# Patient Record
Sex: Female | Born: 1990 | Race: White | Hispanic: No | Marital: Single | Smoking: Current every day smoker
Health system: Southern US, Community
[De-identification: ages and names within clinical notes are randomized; demographics above are authoritative.]

## PROBLEM LIST (undated history)

## (undated) DIAGNOSIS — K5190 Ulcerative colitis, unspecified, without complications: Secondary | ICD-10-CM

## (undated) DIAGNOSIS — F419 Anxiety disorder, unspecified: Secondary | ICD-10-CM

## (undated) DIAGNOSIS — K529 Noninfective gastroenteritis and colitis, unspecified: Secondary | ICD-10-CM

---

## 2010-02-26 ENCOUNTER — Emergency Department (HOSPITAL_BASED_OUTPATIENT_CLINIC_OR_DEPARTMENT_OTHER)
Admission: EM | Admit: 2010-02-26 | Discharge: 2010-02-26 | Payer: Self-pay | Source: Home / Self Care | Admitting: Emergency Medicine

## 2010-02-26 ENCOUNTER — Telehealth: Payer: Self-pay | Admitting: Internal Medicine

## 2010-04-28 NOTE — Progress Notes (Signed)
Summary: Schedule NP3 , UC per Dr Leone Payor  Phone Note Outgoing Call Call back at [facility] Phone [phone]   Call placed by: Darcey Nora RN, CGRN,  February 26, 2010 5:01 PM Call placed to: Patient Summary of Call: Per Dr Leone Payor patient was seen at [facility] today for UC, recently moved here from [place].  Patient  needs to be seen tomorrow or Monday.  I have left her a message to call me back .  I have scheduled her to see Willette Cluster RNP 02/27/10 1:30.   Initial call taken by: Darcey Nora RN, CGRN,  February 26, 2010 5:04 PM  Follow-up for Phone Call        Left message for patient to call back regarding appointment for today. Selinda Michaels RN  February 27, 2010 9:27 AM  Patients mother called and cancelled the appointment for today with Willette Cluster, RNP, states that she will continue her GI care in [facility]. Selinda Michaels RN  February 27, 2010 9:30 AM Follow-up by: Selinda Michaels RN,  February 27, 2010 9:31 AM  Additional Follow-up for Phone Call Additional follow up Details #1::        ok - I  asked about that and was told she did not have a GI MD in [place] Thanks  Additional Follow-up by: Iva Boop MD, Clementeen Graham,  February 27, 2010 10:21 AM

## 2010-06-09 LAB — DIFFERENTIAL
Basophils Absolute: 0 10*3/uL (ref 0.0–0.1)
Basophils Relative: 0 % (ref 0–1)
Eosinophils Absolute: 0.2 10*3/uL (ref 0.0–0.7)
Monocytes Absolute: 0.5 10*3/uL (ref 0.1–1.0)
Monocytes Relative: 5 % (ref 3–12)
Neutro Abs: 9.3 10*3/uL — ABNORMAL HIGH (ref 1.7–7.7)
Neutrophils Relative %: 84 % — ABNORMAL HIGH (ref 43–77)

## 2010-06-09 LAB — PREGNANCY, URINE: Preg Test, Ur: NEGATIVE

## 2010-06-09 LAB — COMPREHENSIVE METABOLIC PANEL
ALT: 15 U/L (ref 0–35)
Albumin: 4.6 g/dL (ref 3.5–5.2)
Alkaline Phosphatase: 69 U/L (ref 39–117)
BUN: 21 mg/dL (ref 6–23)
Chloride: 109 mEq/L (ref 96–112)
Glucose, Bld: 99 mg/dL (ref 70–99)
Potassium: 3.9 mEq/L (ref 3.5–5.1)
Sodium: 144 mEq/L (ref 135–145)
Total Bilirubin: 0.4 mg/dL (ref 0.3–1.2)

## 2010-06-09 LAB — CBC
HCT: 36.5 % (ref 36.0–46.0)
MCV: 84.2 fL (ref 78.0–100.0)
RBC: 4.34 MIL/uL (ref 3.87–5.11)
WBC: 11 10*3/uL — ABNORMAL HIGH (ref 4.0–10.5)

## 2010-06-09 LAB — URINALYSIS, ROUTINE W REFLEX MICROSCOPIC
Bilirubin Urine: NEGATIVE
Ketones, ur: NEGATIVE mg/dL
Nitrite: NEGATIVE
Protein, ur: NEGATIVE mg/dL
Urobilinogen, UA: 0.2 mg/dL (ref 0.0–1.0)

## 2010-06-09 LAB — URINE MICROSCOPIC-ADD ON

## 2010-06-09 LAB — WET PREP, GENITAL: Trich, Wet Prep: NONE SEEN

## 2010-06-09 LAB — GC/CHLAMYDIA PROBE AMP, GENITAL
Chlamydia, DNA Probe: NEGATIVE
GC Probe Amp, Genital: NEGATIVE

## 2010-07-15 ENCOUNTER — Emergency Department (HOSPITAL_BASED_OUTPATIENT_CLINIC_OR_DEPARTMENT_OTHER)
Admission: EM | Admit: 2010-07-15 | Discharge: 2010-07-15 | Disposition: A | Discharge status: Home / Self Care | Payer: 59 | Attending: Emergency Medicine | Admitting: Emergency Medicine

## 2010-07-15 DIAGNOSIS — R112 Nausea with vomiting, unspecified: Secondary | ICD-10-CM | POA: Insufficient documentation

## 2010-07-15 DIAGNOSIS — F17200 Nicotine dependence, unspecified, uncomplicated: Secondary | ICD-10-CM | POA: Insufficient documentation

## 2010-07-15 DIAGNOSIS — H9209 Otalgia, unspecified ear: Secondary | ICD-10-CM | POA: Insufficient documentation

## 2010-07-15 DIAGNOSIS — H6120 Impacted cerumen, unspecified ear: Secondary | ICD-10-CM | POA: Insufficient documentation

## 2010-07-15 LAB — DIFFERENTIAL
Basophils Absolute: 0 10*3/uL (ref 0.0–0.1)
Basophils Relative: 1 % (ref 0–1)
Eosinophils Absolute: 0.3 10*3/uL (ref 0.0–0.7)
Eosinophils Relative: 4 % (ref 0–5)
Monocytes Absolute: 0.6 10*3/uL (ref 0.1–1.0)
Monocytes Relative: 8 % (ref 3–12)

## 2010-07-15 LAB — URINALYSIS, ROUTINE W REFLEX MICROSCOPIC
Hgb urine dipstick: NEGATIVE
Nitrite: NEGATIVE
Protein, ur: NEGATIVE mg/dL
Specific Gravity, Urine: 1.027 (ref 1.005–1.030)
Urobilinogen, UA: 1 mg/dL (ref 0.0–1.0)

## 2010-07-15 LAB — CBC
Hemoglobin: 11.6 g/dL — ABNORMAL LOW (ref 12.0–15.0)
MCH: 27.8 pg (ref 26.0–34.0)
MCHC: 33.8 g/dL (ref 30.0–36.0)
RDW: 13.9 % (ref 11.5–15.5)

## 2010-07-15 LAB — URINE MICROSCOPIC-ADD ON

## 2011-06-21 ENCOUNTER — Emergency Department (HOSPITAL_BASED_OUTPATIENT_CLINIC_OR_DEPARTMENT_OTHER)
Admission: EM | Admit: 2011-06-21 | Discharge: 2011-06-21 | Disposition: A | Discharge status: Home / Self Care | Payer: 59 | Attending: Emergency Medicine | Admitting: Emergency Medicine

## 2011-06-21 ENCOUNTER — Encounter (HOSPITAL_BASED_OUTPATIENT_CLINIC_OR_DEPARTMENT_OTHER): Payer: Self-pay | Admitting: *Deleted

## 2011-06-21 DIAGNOSIS — N760 Acute vaginitis: Secondary | ICD-10-CM | POA: Insufficient documentation

## 2011-06-21 DIAGNOSIS — L299 Pruritus, unspecified: Secondary | ICD-10-CM | POA: Insufficient documentation

## 2011-06-21 DIAGNOSIS — Z79899 Other long term (current) drug therapy: Secondary | ICD-10-CM | POA: Insufficient documentation

## 2011-06-21 DIAGNOSIS — B9689 Other specified bacterial agents as the cause of diseases classified elsewhere: Secondary | ICD-10-CM | POA: Insufficient documentation

## 2011-06-21 DIAGNOSIS — R109 Unspecified abdominal pain: Secondary | ICD-10-CM | POA: Insufficient documentation

## 2011-06-21 DIAGNOSIS — R21 Rash and other nonspecific skin eruption: Secondary | ICD-10-CM | POA: Insufficient documentation

## 2011-06-21 DIAGNOSIS — A499 Bacterial infection, unspecified: Secondary | ICD-10-CM | POA: Insufficient documentation

## 2011-06-21 HISTORY — DX: Noninfective gastroenteritis and colitis, unspecified: K52.9

## 2011-06-21 LAB — URINALYSIS, ROUTINE W REFLEX MICROSCOPIC
Glucose, UA: NEGATIVE mg/dL
Ketones, ur: 15 mg/dL — AB
Protein, ur: NEGATIVE mg/dL
Urobilinogen, UA: 1 mg/dL (ref 0.0–1.0)

## 2011-06-21 LAB — BASIC METABOLIC PANEL
BUN: 9 mg/dL (ref 6–23)
Chloride: 101 mEq/L (ref 96–112)
Creatinine, Ser: 0.5 mg/dL (ref 0.50–1.10)
Glucose, Bld: 87 mg/dL (ref 70–99)
Potassium: 3.4 mEq/L — ABNORMAL LOW (ref 3.5–5.1)

## 2011-06-21 LAB — WET PREP, GENITAL
Trich, Wet Prep: NONE SEEN
Yeast Wet Prep HPF POC: NONE SEEN

## 2011-06-21 LAB — DIFFERENTIAL
Lymphs Abs: 0.7 10*3/uL (ref 0.7–4.0)
Monocytes Absolute: 0.4 10*3/uL (ref 0.1–1.0)
Monocytes Relative: 5 % (ref 3–12)
Neutro Abs: 6.8 10*3/uL (ref 1.7–7.7)
Neutrophils Relative %: 84 % — ABNORMAL HIGH (ref 43–77)

## 2011-06-21 LAB — CBC
HCT: 38.2 % (ref 36.0–46.0)
Hemoglobin: 12.6 g/dL (ref 12.0–15.0)
MCH: 27.4 pg (ref 26.0–34.0)
RBC: 4.6 MIL/uL (ref 3.87–5.11)

## 2011-06-21 LAB — URINE MICROSCOPIC-ADD ON

## 2011-06-21 MED ORDER — PERMETHRIN 5 % EX CREA: TOPICAL_CREAM | Freq: Once | CUTANEOUS | Status: AC

## 2011-06-21 MED ORDER — METRONIDAZOLE 500 MG PO TABS: 500.0000 mg | ORAL_TABLET | Freq: Two times a day (BID) | ORAL | Status: AC

## 2011-06-21 NOTE — ED Provider Notes (Signed)
History     CSN: [id number]  Arrival date & time 06/21/11  1725   First MD Initiated Contact with Patient 06/21/11 1758      Chief Complaint  Patient presents with  . Abdominal Pain    (Consider location/radiation/quality/duration/timing/severity/associated sxs/prior treatment) HPI The patient is a 21 yo woman, with a 6-year history of ulcerative colitis, presenting with a 47-month history of abd pain and vaginal discharge.  The patient notes a 53-month history of "crampy" intermittent lower abdominal pain, which comes and goes throughout the day in episodes that last seconds to hours.  She notes no aggravating or alleviating factors.  For the last few days the pain has been radiating to her back, and today the patient also noted feeling nauseous and lightheaded, though with no vomiting.  The patient also notes a 23-month history of thick white vaginal discharge, with no vaginal pain, burning, or odor.  The patient is currently sexually active and does not use contraception, and has been sexually active with 2 partners in the last year.  She has no history of STD's.  The patient also notes a 1-2 week history of an itchy rash on the back of her hands and on her stomach, but is unsure of the etiology.  No diarrhea, constipation, BRBPR or melena, and she states that her symptoms are not characteristic of her typical UC symptoms.  Past Medical History  Diagnosis Date  . Colitis     History reviewed. No pertinent past surgical history.  No family history on file.  History  Substance Use Topics  . Smoking status: Not on file  . Smokeless tobacco: Not on file  . Alcohol Use: No    OB History    Grav Para Term Preterm Abortions TAB SAB Ect Mult Living                  Review of Systems General: no fevers, chills, changes in weight, changes in appetite Skin: see HPI. HEENT: no blurry vision, hearing changes, sore throat Pulm: no dyspnea, coughing, wheezing CV: no chest pain,  palpitations, shortness of breath Abd: see HPI GU: see HPI Ext: no arthralgias, myalgias Neuro: no weakness, numbness, or tingling  Allergies  Review of patient's allergies indicates no known allergies.  Home Medications   Current Outpatient Rx  Name Route Sig Dispense Refill  . IBUPROFEN 200 MG PO TABS Oral Take 200 mg by mouth every 6 (six) hours as needed.    Marland Kitchen MESALAMINE 400 MG PO TBEC Oral Take 400 mg by mouth 3 (three) times daily.    . AZO TABS PO Oral Take by mouth.      BP 125/79  Pulse 113  Temp(Src) 98.8 F (37.1 C) (Oral)  Resp 20  SpO2 100%  LMP 05/24/2011  Physical Exam General: alert, cooperative, and in no apparent distress HEENT: pupils equal round and reactive to light, vision grossly intact, oropharynx clear and non-erythematous  Neck: supple, no lymphadenopathy Lungs: clear to ascultation bilaterally, normal work of respiration, no wheezes, rales, ronchi Heart: regular rate and rhythm, no murmurs, gallops, or rubs Abdomen: soft, non-tender to palpation, non-distended, normal bowel sounds, few scattered punctate erythematous macules noted on abdomen   Pelvic: labia with no external lesions, white discharge noted in vaginal vault Extremities: bilateral dorsal hands and few scattered punctate erythematous macules, also noted in web spaces between fingers Neurologic: alert & oriented X3, cranial nerves II-XII intact, strength grossly intact, sensation intact to light touch  ED Course  Procedures (including critical care time)  Labs Reviewed  URINALYSIS, ROUTINE W REFLEX MICROSCOPIC - Abnormal; Notable for the following:    APPearance CLOUDY (*)    Ketones, ur 15 (*)    Leukocytes, UA SMALL (*)    All other components within normal limits  URINE MICROSCOPIC-ADD ON - Abnormal; Notable for the following:    Squamous Epithelial / LPF FEW (*)    Bacteria, UA MANY (*)    All other components within normal limits  PREGNANCY, URINE  CBC  DIFFERENTIAL    BASIC METABOLIC PANEL  GC/CHLAMYDIA PROBE AMP, GENITAL  RPR  HIV ANTIBODY (ROUTINE TESTING)  WET PREP, GENITAL   No results found.   No diagnosis found.    MDM  The patient is a 21 yo woman with a history of ulcerative colitis, presenting with crampy abdominal pain and vaginal discharge.  # Abd pain/vaginal discharge - differential includes vaginitis vs chalmydia/gonorrhea vs ectopic pregnancy.  Patient states symptoms are unlike her usual UC flares, and she notes no diarrhea or BRBPR. -wet prep shows BV -urine pregnancy test negative -GC/chlamydia pending  # Rash - on dorsum of hands and stomach, itchy, with some areas in web spaces of fingers noted, spares palms/soles.  Appears consistent with Scabies.  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

## 2011-06-21 NOTE — ED Notes (Signed)
Abdominal cramps on and off for a month. Nausea today.

## 2011-06-21 NOTE — Discharge Instructions (Signed)
Bacterial Vaginosis Bacterial vaginosis (BV) is a vaginal infection where the normal balance of bacteria in the vagina is disrupted. The normal balance is then replaced by an overgrowth of certain bacteria. There are several different kinds of bacteria that can cause BV. BV is the most common vaginal infection in women of childbearing age. CAUSES   The cause of BV is not fully understood. BV develops when there is an increase or imbalance of harmful bacteria.   Some activities or behaviors can upset the normal balance of bacteria in the vagina and put women at increased risk including:   Having a new sex partner or multiple sex partners.   Douching.   Using an intrauterine device (IUD) for contraception.   It is not clear what role sexual activity plays in the development of BV. However, women that have never had sexual intercourse are rarely infected with BV.  Women do not get BV from toilet seats, bedding, swimming pools or from touching objects around them.  SYMPTOMS   Grey vaginal discharge.   A fish-like odor with discharge, especially after sexual intercourse.   Itching or burning of the vagina and vulva.   Burning or pain with urination.   Some women have no signs or symptoms at all.  DIAGNOSIS  Your caregiver must examine the vagina for signs of BV. Your caregiver will perform lab tests and look at the sample of vaginal fluid through a microscope. They will look for bacteria and abnormal cells (clue cells), a pH test higher than 4.5, and a positive amine test all associated with BV.  RISKS AND COMPLICATIONS   Pelvic inflammatory disease (PID).   Infections following gynecology surgery.   Developing HIV.   Developing herpes virus.  TREATMENT  Sometimes BV will clear up without treatment. However, all women with symptoms of BV should be treated to avoid complications, especially if gynecology surgery is planned. Female partners generally do not need to be treated. However,  BV may spread between female sex partners so treatment is helpful in preventing a recurrence of BV.   BV may be treated with antibiotics. The antibiotics come in either pill or vaginal cream forms. Either can be used with nonpregnant or pregnant women, but the recommended dosages differ. These antibiotics are not harmful to the baby.   BV can recur after treatment. If this happens, a second round of antibiotics will often be prescribed.   Treatment is important for pregnant women. If not treated, BV can cause a premature delivery, especially for a pregnant woman who had a premature birth in the past. All pregnant women who have symptoms of BV should be checked and treated.   For chronic reoccurrence of BV, treatment with a type of prescribed gel vaginally twice a week is helpful.  HOME CARE INSTRUCTIONS   Finish all medication as directed by your caregiver.   Do not have sex until treatment is completed.   Tell your sexual partner that you have a vaginal infection. They should see their caregiver and be treated if they have problems, such as a mild rash or itching.   Practice safe sex. Use condoms. Only have 1 sex partner.  PREVENTION  Basic prevention steps can help reduce the risk of upsetting the natural balance of bacteria in the vagina and developing BV:  Do not have sexual intercourse (be abstinent).   Do not douche.   Use all of the medicine prescribed for treatment of BV, even if the signs and symptoms go away.     Tell your sex partner if you have BV. That way, they can be treated, if needed, to prevent reoccurrence.  SEEK MEDICAL CARE IF:   Your symptoms are not improving after 3 days of treatment.   You have increased discharge, pain, or fever.  MAKE SURE YOU:   Understand these instructions.   Will watch your condition.   Will get help right away if you are not doing well or get worse.  FOR MORE INFORMATION  Division of STD Prevention (DSTDP), Centers for Disease  Control and Prevention: SolutionApps.co.za American Social Health Association (ASHA): www.ashastd.org  Document Released: 03/15/2005 Document Revised: 03/04/2011 Document Reviewed: 09/05/2008 [facility] Patient Information 2012 [place], [place].   Scabies Scabies are small bugs (mites) that burrow under the skin and cause red bumps and severe itching. These bugs can only be seen with a microscope. Scabies are highly contagious. They can spread easily from person to person by direct contact. They are also spread through sharing clothing or linens that have the scabies mites living in them. It is not unusual for an entire family to become infected through shared towels, clothing, or bedding.  HOME CARE INSTRUCTIONS   Your caregiver may prescribe a cream or lotion to kill the mites. If this cream is prescribed; massage the cream into the entire area of the body from the neck to the bottom of both feet. Also massage the cream into the scalp and face if your child is less than 14 year old. Avoid the eyes and mouth.   Leave the cream on for 8 to12 hours. Do not wash your hands after application. Your child should bathe or shower after the 8 to 12 hour application period. Sometimes it is helpful to apply the cream to your child at right before bedtime.   One treatment is usually effective and will eliminate approximately 95% of infestations. For severe cases, your caregiver may decide to repeat the treatment in 1 week. Everyone in your household should be treated with one application of the cream.   New rashes or burrows should not appear after successful treatment within 24 to 48 hours; however the itching and rash may last for 2 to 4 weeks after successful treatment. If your symptoms persist longer than this, see your caregiver.   Your caregiver also may prescribe a medication to help with the itching or to help the rash go away more quickly.   Scabies can live on clothing or linens for up to 3 days. Your  entire child's recently used clothing, towels, stuffed toys, and bed linens should be washed in hot water and then dried in a dryer for at least 20 minutes on high heat. Items that cannot be washed should be enclosed in a plastic bag for at least 3 days.   To help relieve itching, bathe your child in a cool bath or apply cool washcloths to the affected areas.   Your child may return to school after treatment with the prescribed cream.  SEEK MEDICAL CARE IF:   The itching persists longer than 4 weeks after treatment.   The rash spreads or becomes infected (the area has red blisters or yellow-tan crust).  Document Released: 03/15/2005 Document Revised: 03/04/2011 Document Reviewed: 07/24/2008 [facility] Patient Information 2012 [place], [place].   RESOURCE GUIDE  Dental Problems  Patients with Medicaid: [facility] [phone] [address]  [address] Phone:  [phone]                                                  Phone:  [phone]  If unable to pay or uninsured, contact:  [facility] or [facility]. to become qualified for the adult dental clinic.  Chronic Pain Problems Contact [facility]  [phone] Patients need to be referred by their primary care doctor.  Insufficient Money for Medicine Contact United Way:  call "211" or [facility] [phone].  No Primary Care Doctor Call [facility]  8023091138 Other agencies that provide inexpensive medical care    [facility]  [phone]    [facility]  [phone]    [facility]  [phone]    [facility]  [phone]    [facility]  [phone]    [facility]  [phone]  Psychological Services [facility]  [phone] [facility]  [phone] [facility]   [phone] (emergency services [phone])  Substance  Abuse Resources [facility] Drug Services  [phone] [facility] [phone] [facility] [place] [phone] Floydene Flock [phone] Residential & Outpatient Substance Abuse Program  [phone]  Abuse/Neglect [facility] Child Abuse Hotline (972)483-2977 [facility] Child Abuse Hotline 567-611-5394 (After Hours)  Emergency Shelter [facility] [phone]  Maternity Homes [facility] [phone] [facility] [phone]  MRSA Hotline #:   608-355-6879    [facility] Resources  [facility] [place]     United [employer]                          [facility] Dept. [address] [place]                       [address]      [address]  Blondell Reveal Phone:  [phone]                                   Phone:  [phone]                 Phone:  [phone]  [facility] Phone:  [phone]  [facility] Child Abuse Hotline 423-064-4825 [phone] (After Hours)

## 2011-06-22 LAB — RPR: RPR Ser Ql: NONREACTIVE

## 2011-06-22 NOTE — ED Provider Notes (Signed)
I saw and evaluated the patient, reviewed the resident's note and I agree with the findings and plan.   .Face to face Exam:  General:  Awake HEENT:  Atraumatic Resp:  Normal effort Abd:  Nondistended Neuro:No focal weakness Lymph: No adenopathy   Nelia Shi, MD 06/22/11 1247

## 2012-08-28 ENCOUNTER — Emergency Department (HOSPITAL_BASED_OUTPATIENT_CLINIC_OR_DEPARTMENT_OTHER)
Admission: EM | Admit: 2012-08-28 | Discharge: 2012-08-28 | Disposition: A | Discharge status: Home / Self Care | Payer: Self-pay | Attending: Emergency Medicine | Admitting: Emergency Medicine

## 2012-08-28 ENCOUNTER — Encounter (HOSPITAL_BASED_OUTPATIENT_CLINIC_OR_DEPARTMENT_OTHER): Payer: Self-pay

## 2012-08-28 DIAGNOSIS — F17200 Nicotine dependence, unspecified, uncomplicated: Secondary | ICD-10-CM | POA: Insufficient documentation

## 2012-08-28 DIAGNOSIS — Z202 Contact with and (suspected) exposure to infections with a predominantly sexual mode of transmission: Secondary | ICD-10-CM | POA: Insufficient documentation

## 2012-08-28 DIAGNOSIS — Z331 Pregnant state, incidental: Secondary | ICD-10-CM | POA: Insufficient documentation

## 2012-08-28 DIAGNOSIS — Z79899 Other long term (current) drug therapy: Secondary | ICD-10-CM | POA: Insufficient documentation

## 2012-08-28 DIAGNOSIS — Z8719 Personal history of other diseases of the digestive system: Secondary | ICD-10-CM | POA: Insufficient documentation

## 2012-08-28 LAB — URINALYSIS, ROUTINE W REFLEX MICROSCOPIC
Glucose, UA: NEGATIVE mg/dL
Ketones, ur: 15 mg/dL — AB
Protein, ur: NEGATIVE mg/dL

## 2012-08-28 LAB — URINE MICROSCOPIC-ADD ON

## 2012-08-28 LAB — WET PREP, GENITAL
Trich, Wet Prep: NONE SEEN
Yeast Wet Prep HPF POC: NONE SEEN

## 2012-08-28 MED ORDER — CEFTRIAXONE SODIUM 250 MG IJ SOLR
250.0000 mg | Freq: Once | INTRAMUSCULAR | Status: AC
  Administered 2012-08-28: 250 mg via INTRAMUSCULAR
  Filled 2012-08-28: qty 250

## 2012-08-28 MED ORDER — AZITHROMYCIN 250 MG PO TABS
1000.0000 mg | ORAL_TABLET | Freq: Once | ORAL | Status: AC
  Administered 2012-08-28: 1000 mg via ORAL
  Filled 2012-08-28: qty 4

## 2012-08-28 NOTE — ED Provider Notes (Signed)
History     CSN: [id number]  Arrival date & time 08/28/12  1324   First MD Initiated Contact with Patient 08/28/12 1335      Chief Complaint  Patient presents with  . Exposure to STD  . Vaginal Discharge    (Consider location/radiation/quality/duration/timing/severity/associated sxs/prior treatment) Patient is a 22 y.o. female presenting with STD exposure and vaginal discharge. The history is provided by the patient. No language interpreter was used.  Exposure to STD This is a new problem. The current episode started in the past 7 days. The problem occurs constantly. The problem has been gradually worsening. Pertinent negatives include no abdominal pain or fever. Nothing aggravates the symptoms. She has tried nothing for the symptoms.  Vaginal Discharge Associated symptoms: no abdominal pain and no fever   Pt reports partner was diagnosed with gonorhea  Past Medical History  Diagnosis Date  . Colitis     History reviewed. No pertinent past surgical history.  No family history on file.  History  Substance Use Topics  . Smoking status: Current Every Day Smoker  . Smokeless tobacco: Not on file  . Alcohol Use: Yes    OB History   Grav Para Term Preterm Abortions TAB SAB Ect Mult Living                  Review of Systems  Constitutional: Negative for fever.  Gastrointestinal: Negative for abdominal pain.  Genitourinary: Positive for vaginal discharge.  All other systems reviewed and are negative.    Allergies  Review of patient's allergies indicates no known allergies.  Home Medications   Current Outpatient Rx  Name  Route  Sig  Dispense  Refill  . ibuprofen (ADVIL,MOTRIN) 200 MG tablet   Oral   Take 200 mg by mouth every 6 (six) hours as needed.         . mesalamine (ASACOL) 400 MG EC tablet   Oral   Take 400 mg by mouth 3 (three) times daily.         . Phenazopyridine HCl (AZO TABS PO)   Oral   Take by mouth.           BP 124/73  Pulse 102   Temp(Src) 99 F (37.2 C)  Resp 16  Ht 5\' 1"  (1.549 m)  Wt 90 lb (40.824 kg)  BMI 17.01 kg/m2  SpO2 100%  LMP 07/11/2012  Physical Exam  Constitutional: She is oriented to person, place, and time. She appears well-developed.  HENT:  Head: Normocephalic.  Right Ear: External ear normal.  Left Ear: External ear normal.  Eyes: Conjunctivae and EOM are normal. Pupils are equal, round, and reactive to light.  Neck: Normal range of motion. Neck supple.  Cardiovascular: Normal rate.   Pulmonary/Chest: Effort normal and breath sounds normal.  Abdominal: Soft.  Genitourinary: Vaginal discharge found.  Musculoskeletal: Normal range of motion.  Neurological: She is alert and oriented to person, place, and time. She has normal reflexes.  Skin: Skin is warm.  Psychiatric: She has a normal mood and affect.    ED Course  Procedures (including critical care time)  Labs Reviewed  URINALYSIS, ROUTINE W REFLEX MICROSCOPIC - Abnormal; Notable for the following:    Ketones, ur 15 (*)    Leukocytes, UA TRACE (*)    All other components within normal limits  PREGNANCY, URINE - Abnormal; Notable for the following:    Preg Test, Ur POSITIVE (*)    All other components within normal limits  URINE MICROSCOPIC-ADD ON - Abnormal; Notable for the following:    Squamous Epithelial / LPF FEW (*)    Bacteria, UA FEW (*)    All other components within normal limits  WET PREP, GENITAL  GC/CHLAMYDIA PROBE AMP   No results found.   1. Exposure to STD   2. Pregnancy as incidental finding       MDM  Rocephin and zithromax        Elson Areas, PA-C 08/28/12 [address] [place], [place] 08/28/12 1424

## 2012-08-28 NOTE — ED Notes (Signed)
Vaginal d/c x 1 week-was advised by sexual partner +gonorrhea

## 2012-08-28 NOTE — ED Provider Notes (Signed)
Medical screening examination/treatment/procedure(s) were performed by non-physician practitioner and as supervising physician I was immediately available for consultation/collaboration.   Dawna Jakes B. Fannye Myer, MD 08/28/12 1451 

## 2012-08-29 LAB — GC/CHLAMYDIA PROBE AMP: CT Probe RNA: NEGATIVE

## 2012-09-07 NOTE — ED Notes (Signed)
Patient called for test results and was informed that test are negative.

## 2012-12-29 ENCOUNTER — Emergency Department (HOSPITAL_BASED_OUTPATIENT_CLINIC_OR_DEPARTMENT_OTHER): Payer: 59

## 2012-12-29 ENCOUNTER — Encounter (HOSPITAL_BASED_OUTPATIENT_CLINIC_OR_DEPARTMENT_OTHER): Payer: Self-pay

## 2012-12-29 ENCOUNTER — Emergency Department (HOSPITAL_BASED_OUTPATIENT_CLINIC_OR_DEPARTMENT_OTHER)
Admission: EM | Admit: 2012-12-29 | Discharge: 2012-12-29 | Disposition: A | Discharge status: Home / Self Care | Payer: Self-pay | Attending: Emergency Medicine | Admitting: Emergency Medicine

## 2012-12-29 DIAGNOSIS — Z3202 Encounter for pregnancy test, result negative: Secondary | ICD-10-CM | POA: Insufficient documentation

## 2012-12-29 DIAGNOSIS — K5190 Ulcerative colitis, unspecified, without complications: Secondary | ICD-10-CM | POA: Insufficient documentation

## 2012-12-29 DIAGNOSIS — Z79899 Other long term (current) drug therapy: Secondary | ICD-10-CM | POA: Insufficient documentation

## 2012-12-29 DIAGNOSIS — F17200 Nicotine dependence, unspecified, uncomplicated: Secondary | ICD-10-CM | POA: Insufficient documentation

## 2012-12-29 LAB — COMPREHENSIVE METABOLIC PANEL
Albumin: 4 g/dL (ref 3.5–5.2)
BUN: 10 mg/dL (ref 6–23)
Chloride: 101 mEq/L (ref 96–112)
Creatinine, Ser: 0.7 mg/dL (ref 0.50–1.10)
GFR calc Af Amer: >90 mL/min (ref >90)
Glucose, Bld: 96 mg/dL (ref 70–99)
Total Bilirubin: 0.2 mg/dL — ABNORMAL LOW (ref 0.3–1.2)

## 2012-12-29 LAB — CBC WITH DIFFERENTIAL/PLATELET
Basophils Relative: 0 % (ref 0–1)
Eosinophils Absolute: 0.3 10*3/uL (ref 0.0–0.7)
HCT: 37.8 % (ref 36.0–46.0)
Hemoglobin: 12.2 g/dL (ref 12.0–15.0)
MCH: 26.4 pg (ref 26.0–34.0)
MCHC: 32.3 g/dL (ref 30.0–36.0)
Monocytes Absolute: 0.7 10*3/uL (ref 0.1–1.0)
Monocytes Relative: 5 % (ref 3–12)

## 2012-12-29 LAB — URINE MICROSCOPIC-ADD ON

## 2012-12-29 LAB — URINALYSIS, ROUTINE W REFLEX MICROSCOPIC
Bilirubin Urine: NEGATIVE
Hgb urine dipstick: NEGATIVE
Specific Gravity, Urine: 1.021 (ref 1.005–1.030)
pH: 7.5 (ref 5.0–8.0)

## 2012-12-29 LAB — LIPASE, BLOOD: Lipase: 22 U/L (ref 11–59)

## 2012-12-29 MED ORDER — PREDNISONE 10 MG PO TABS: 20.0000 mg | ORAL_TABLET | Freq: Every day | ORAL | Status: DC

## 2012-12-29 MED ORDER — IOHEXOL 300 MG/ML  SOLN
50.0000 mL | Freq: Once | INTRAMUSCULAR | Status: AC | PRN
  Administered 2012-12-29: 50 mL via ORAL

## 2012-12-29 MED ORDER — IOHEXOL 300 MG/ML  SOLN
100.0000 mL | Freq: Once | INTRAMUSCULAR | Status: AC | PRN
  Administered 2012-12-29: 100 mL via INTRAVENOUS

## 2012-12-29 MED ORDER — HYDROMORPHONE HCL PF 1 MG/ML IJ SOLN
1.0000 mg | Freq: Once | INTRAMUSCULAR | Status: AC
  Administered 2012-12-29: 1 mg via INTRAVENOUS
  Filled 2012-12-29: qty 1

## 2012-12-29 MED ORDER — PREDNISONE 50 MG PO TABS
60.0000 mg | ORAL_TABLET | Freq: Once | ORAL | Status: AC
  Administered 2012-12-29: 60 mg via ORAL
  Filled 2012-12-29 (×2): qty 1

## 2012-12-29 MED ORDER — OXYCODONE-ACETAMINOPHEN 5-325 MG PO TABS: 2.0000 | ORAL_TABLET | ORAL | Status: DC | PRN

## 2012-12-29 MED ORDER — ONDANSETRON 8 MG PO TBDP: 8.0000 mg | ORAL_TABLET | Freq: Three times a day (TID) | ORAL | Status: DC | PRN

## 2012-12-29 MED ORDER — SODIUM CHLORIDE 0.9 % IV BOLUS (SEPSIS)
1000.0000 mL | Freq: Once | INTRAVENOUS | Status: AC
  Administered 2012-12-29: 1000 mL via INTRAVENOUS

## 2012-12-29 MED ORDER — ONDANSETRON HCL 4 MG/2ML IJ SOLN
4.0000 mg | Freq: Once | INTRAMUSCULAR | Status: AC
  Administered 2012-12-29: 4 mg via INTRAVENOUS
  Filled 2012-12-29: qty 2

## 2012-12-29 NOTE — ED Provider Notes (Signed)
CSN: [id number]     Arrival date & time 12/29/12  1554 History   First MD Initiated Contact with Patient 12/29/12 1642     Chief Complaint  Patient presents with  . Abdominal Pain   (Consider location/radiation/quality/duration/timing/severity/associated sxs/prior Treatment) HPI 22 y.o. Female with history of ulcerative colitis who hasn't been treated for one year who states she has chronic diarrhea and base line diffuse abominal pain with worsened pain and diarrhea today.  No fever or chills.  She is not on any immunosuppressant.  She has regular menses.  She denies uti symptoms.  She has not been seen by a physician since moving to [place] from [place] a year ago and no longer has health insurance.  Past Medical History  Diagnosis Date  . Colitis    History reviewed. No pertinent past surgical history. No family history on file. History  Substance Use Topics  . Smoking status: Current Every Day Smoker  . Smokeless tobacco: Not on file  . Alcohol Use: Yes   OB History   Grav Para Term Preterm Abortions TAB SAB Ect Mult Living                 Review of Systems  All other systems reviewed and are negative.    Allergies  Review of patient's allergies indicates no known allergies.  Home Medications   Current Outpatient Rx  Name  Route  Sig  Dispense  Refill  . ibuprofen (ADVIL,MOTRIN) 200 MG tablet   Oral   Take 200 mg by mouth every 6 (six) hours as needed.         . mesalamine (ASACOL) 400 MG EC tablet   Oral   Take 400 mg by mouth 3 (three) times daily.         . Phenazopyridine HCl (AZO TABS PO)   Oral   Take by mouth.          BP 118/78  Pulse 104  Temp(Src) 99.3 F (37.4 C) (Oral)  Resp 16  Ht 5' (1.524 m)  Wt 83 lb (37.649 kg)  BMI 16.21 kg/m2  SpO2 98%  LMP 12/04/2012 Physical Exam  Nursing note and vitals reviewed. Constitutional: She is oriented to person, place, and time. She appears well-developed and well-nourished.  HENT:  Head:  Normocephalic and atraumatic.  Right Ear: External ear normal.  Left Ear: External ear normal.  Nose: Nose normal.  Mouth/Throat: Oropharynx is clear and moist.  Eyes: Conjunctivae and EOM are normal. Pupils are equal, round, and reactive to light.  Neck: Normal range of motion. Neck supple.  Cardiovascular: Normal rate, regular rhythm, normal heart sounds and intact distal pulses.   Pulmonary/Chest: Effort normal and breath sounds normal.  Abdominal: Soft. Bowel sounds are normal. She exhibits no distension and no mass. There is no rebound and no guarding.  Mild diffuse ttp  Musculoskeletal: Normal range of motion. She exhibits no edema and no tenderness.  Neurological: She is alert and oriented to person, place, and time.  Skin: Skin is warm and dry.  Psychiatric: She has a normal mood and affect. Her behavior is normal. Judgment and thought content normal.    ED Course  Procedures (including critical care time) Labs Review Labs Reviewed  URINALYSIS, ROUTINE W REFLEX MICROSCOPIC - Abnormal; Notable for the following:    Leukocytes, UA MODERATE (*)    All other components within normal limits  URINE MICROSCOPIC-ADD ON - Abnormal; Notable for the following:    Squamous Epithelial / LPF  FEW (*)    Bacteria, UA FEW (*)    All other components within normal limits  CBC WITH DIFFERENTIAL - Abnormal; Notable for the following:    WBC 13.0 (*)    Neutrophils Relative % 82 (*)    Neutro Abs 10.6 (*)    Lymphocytes Relative 11 (*)    All other components within normal limits  COMPREHENSIVE METABOLIC PANEL - Abnormal; Notable for the following:    Total Bilirubin 0.2 (*)    All other components within normal limits  URINE CULTURE  PREGNANCY, URINE  LIPASE, BLOOD   Imaging Review No results found.  MDM  No diagnosis found. Patient treated here with pain meds and prednisone and iv fluids.  She remains hemodynamically stable.  She will be placed on oral prednisone and is given gi  referral.    Hilario Quarry, MD 12/29/12 2016

## 2012-12-29 NOTE — ED Notes (Signed)
Pt reports she "always" has abd pain r/t ulcerative colitis-reports pain and diarrhea are worse since this am

## 2012-12-29 NOTE — ED Notes (Signed)
Pt will inform nurse when she gets a ride

## 2012-12-29 NOTE — ED Notes (Signed)
Pain med held d/t pt drove self and as no ride

## 2012-12-30 LAB — URINE CULTURE: Colony Count: 30000

## 2013-04-12 ENCOUNTER — Emergency Department (HOSPITAL_BASED_OUTPATIENT_CLINIC_OR_DEPARTMENT_OTHER)
Admission: EM | Admit: 2013-04-12 | Discharge: 2013-04-12 | Disposition: A | Discharge status: Home / Self Care | Payer: 59 | Attending: Emergency Medicine | Admitting: Emergency Medicine

## 2013-04-12 ENCOUNTER — Encounter (HOSPITAL_BASED_OUTPATIENT_CLINIC_OR_DEPARTMENT_OTHER): Payer: Self-pay | Admitting: Emergency Medicine

## 2013-04-12 DIAGNOSIS — Z3202 Encounter for pregnancy test, result negative: Secondary | ICD-10-CM | POA: Insufficient documentation

## 2013-04-12 DIAGNOSIS — N390 Urinary tract infection, site not specified: Secondary | ICD-10-CM | POA: Insufficient documentation

## 2013-04-12 DIAGNOSIS — A599 Trichomoniasis, unspecified: Secondary | ICD-10-CM

## 2013-04-12 DIAGNOSIS — F17200 Nicotine dependence, unspecified, uncomplicated: Secondary | ICD-10-CM | POA: Insufficient documentation

## 2013-04-12 DIAGNOSIS — K5289 Other specified noninfective gastroenteritis and colitis: Secondary | ICD-10-CM | POA: Insufficient documentation

## 2013-04-12 DIAGNOSIS — Z79899 Other long term (current) drug therapy: Secondary | ICD-10-CM | POA: Insufficient documentation

## 2013-04-12 DIAGNOSIS — N760 Acute vaginitis: Secondary | ICD-10-CM | POA: Insufficient documentation

## 2013-04-12 DIAGNOSIS — B9689 Other specified bacterial agents as the cause of diseases classified elsewhere: Secondary | ICD-10-CM | POA: Insufficient documentation

## 2013-04-12 DIAGNOSIS — IMO0002 Reserved for concepts with insufficient information to code with codable children: Secondary | ICD-10-CM | POA: Insufficient documentation

## 2013-04-12 DIAGNOSIS — A499 Bacterial infection, unspecified: Secondary | ICD-10-CM | POA: Insufficient documentation

## 2013-04-12 DIAGNOSIS — A5901 Trichomonal vulvovaginitis: Secondary | ICD-10-CM | POA: Insufficient documentation

## 2013-04-12 LAB — URINE MICROSCOPIC-ADD ON

## 2013-04-12 LAB — WET PREP, GENITAL: YEAST WET PREP: NONE SEEN

## 2013-04-12 LAB — URINALYSIS, ROUTINE W REFLEX MICROSCOPIC
GLUCOSE, UA: NEGATIVE mg/dL
HGB URINE DIPSTICK: NEGATIVE
Ketones, ur: 15 mg/dL — AB
Nitrite: NEGATIVE
Protein, ur: NEGATIVE mg/dL
SPECIFIC GRAVITY, URINE: 1.03 (ref 1.005–1.030)
Urobilinogen, UA: 1 mg/dL (ref 0.0–1.0)
pH: 6 (ref 5.0–8.0)

## 2013-04-12 LAB — PREGNANCY, URINE: PREG TEST UR: NEGATIVE

## 2013-04-12 MED ORDER — LIDOCAINE HCL (PF) 1 % IJ SOLN
INTRAMUSCULAR | Status: AC
  Administered 2013-04-12: 5 mL
  Filled 2013-04-12: qty 5

## 2013-04-12 MED ORDER — METRONIDAZOLE 500 MG PO TABS: 500.0000 mg | ORAL_TABLET | Freq: Two times a day (BID) | ORAL | Status: DC

## 2013-04-12 MED ORDER — AZITHROMYCIN 250 MG PO TABS
1000.0000 mg | ORAL_TABLET | Freq: Once | ORAL | Status: AC
  Administered 2013-04-12: 1000 mg via ORAL
  Filled 2013-04-12: qty 4

## 2013-04-12 MED ORDER — SULFAMETHOXAZOLE-TRIMETHOPRIM 800-160 MG PO TABS: 1.0000 | ORAL_TABLET | Freq: Two times a day (BID) | ORAL | Status: AC

## 2013-04-12 MED ORDER — CEFTRIAXONE SODIUM 250 MG IJ SOLR
250.0000 mg | Freq: Once | INTRAMUSCULAR | Status: AC
  Administered 2013-04-12: 250 mg via INTRAMUSCULAR
  Filled 2013-04-12: qty 250

## 2013-04-12 NOTE — ED Notes (Signed)
Pt. Waiting shot time for rocephin injection.  Pt. Reports no allergies to RN.

## 2013-04-12 NOTE — ED Provider Notes (Signed)
CSN: [id number]     Arrival date & time 04/12/13  1206 History   First MD Initiated Contact with Patient 04/12/13 1218     Chief Complaint  Patient presents with  . Abdominal Pain  . Vaginal Discharge   (Consider location/radiation/quality/duration/timing/severity/associated sxs/prior Treatment) Patient is a 23 y.o. female presenting with abdominal pain and vaginal discharge. The history is provided by the patient.  Abdominal Pain Pain location:  LLQ and RLQ Pain quality: cramping   Pain radiates to:  Does not radiate Pain severity now: 5/10. Onset quality:  Gradual Duration:  1 week Timing:  Constant Progression:  Unchanged Chronicity:  New Relieved by:  Nothing Worsened by:  Nothing tried Ineffective treatments:  None tried Associated symptoms: vaginal discharge   Associated symptoms: no anorexia, no chest pain, no chills, no constipation, no cough, no diarrhea, no dysuria, no fever, no melena, no nausea, no sore throat, no vaginal bleeding and no vomiting   Vaginal Discharge Associated symptoms: abdominal pain   Associated symptoms: no dysuria, no fever, no nausea and no vomiting    Amanda Dominguez is a 23 y.o. female who presents to the ED with white vaginal discharge that started 2 weeks ago followed by abdominal pain that started one week ago. She is a G2 P1, TAB x1. Last pap smear 4 years ago and was normal. Current sex partner x 8 months. Nos history of STI's.  Past Medical History  Diagnosis Date  . Colitis    History reviewed. No pertinent past surgical history. No family history on file. History  Substance Use Topics  . Smoking status: Current Every Day Smoker  . Smokeless tobacco: Not on file  . Alcohol Use: Yes   OB History   Grav Para Term Preterm Abortions TAB SAB Ect Mult Living                 Review of Systems  Constitutional: Negative for fever and chills.  HENT: Negative.  Negative for sore throat.   Eyes: Negative for visual disturbance.   Respiratory: Negative for cough.   Cardiovascular: Negative for chest pain.  Gastrointestinal: Positive for abdominal pain. Negative for nausea, vomiting, diarrhea, constipation, melena and anorexia.  Genitourinary: Positive for vaginal discharge. Negative for dysuria, urgency, frequency, vaginal bleeding and vaginal pain.  Musculoskeletal: Negative for back pain.  Skin: Negative for rash.  Neurological: Negative for syncope and headaches.  Psychiatric/Behavioral: Negative for confusion. The patient is not nervous/anxious.     Allergies  Review of patient's allergies indicates no known allergies.  Home Medications   Current Outpatient Rx  Name  Route  Sig  Dispense  Refill  . ibuprofen (ADVIL,MOTRIN) 200 MG tablet   Oral   Take 200 mg by mouth every 6 (six) hours as needed.         . mesalamine (ASACOL) 400 MG EC tablet   Oral   Take 400 mg by mouth 3 (three) times daily.         . ondansetron (ZOFRAN ODT) 8 MG disintegrating tablet   Oral   Take 1 tablet (8 mg total) by mouth every 8 (eight) hours as needed for nausea.   20 tablet   0   . oxyCODONE-acetaminophen (PERCOCET/ROXICET) 5-325 MG per tablet   Oral   Take 2 tablets by mouth every 4 (four) hours as needed for pain.   15 tablet   0   . Phenazopyridine HCl (AZO TABS PO)   Oral   Take by mouth.         .Marland Kitchen  predniSONE (DELTASONE) 10 MG tablet   Oral   Take 2 tablets (20 mg total) by mouth daily.   15 tablet   0    BP 108/75  Pulse 110  Temp(Src) 98.7 F (37.1 C) (Oral)  Resp 20  Ht 5' (1.524 m)  Wt 83 lb (37.649 kg)  BMI 16.21 kg/m2  SpO2 99%  LMP 04/07/2013 Physical Exam  Nursing note and vitals reviewed. Constitutional: She is oriented to person, place, and time. No distress.  Very thin female   HENT:  Head: Normocephalic and atraumatic.  Eyes: Conjunctivae and EOM are normal.  Neck: Normal range of motion. Neck supple.  Cardiovascular: Normal rate, regular rhythm and normal heart  sounds.   Pulmonary/Chest: Effort normal. She has no wheezes. She has no rales.  Abdominal: Soft. Bowel sounds are normal. There is tenderness in the suprapubic area. There is no rebound, no guarding and no CVA tenderness.  Genitourinary:  External genitalia without lesions. Frothy, bloody discharge vaginal vault. Cervix inflamed, no CMT, no adnexal tenderness. Uterus without palpable enlargement.   Musculoskeletal: Normal range of motion.  Neurological: She is alert and oriented to person, place, and time. No cranial nerve deficit.  Skin: Skin is warm and dry.  Psychiatric: She has a normal mood and affect. Her behavior is normal.   Results for orders placed during the hospital encounter of 04/12/13 (from the past 24 hour(s))  URINALYSIS, ROUTINE W REFLEX MICROSCOPIC     Status: Abnormal   Collection Time    04/12/13 12:17 PM      Result Value Range   Color, Urine AMBER (*) YELLOW   APPearance CLOUDY (*) CLEAR   Specific Gravity, Urine 1.030  1.005 - 1.030   pH 6.0  5.0 - 8.0   Glucose, UA NEGATIVE  NEGATIVE mg/dL   Hgb urine dipstick NEGATIVE  NEGATIVE   Bilirubin Urine SMALL (*) NEGATIVE   Ketones, ur 15 (*) NEGATIVE mg/dL   Protein, ur NEGATIVE  NEGATIVE mg/dL   Urobilinogen, UA 1.0  0.0 - 1.0 mg/dL   Nitrite NEGATIVE  NEGATIVE   Leukocytes, UA LARGE (*) NEGATIVE  PREGNANCY, URINE     Status: None   Collection Time    04/12/13 12:17 PM      Result Value Range   Preg Test, Ur NEGATIVE  NEGATIVE  URINE MICROSCOPIC-ADD ON     Status: Abnormal   Collection Time    04/12/13 12:17 PM      Result Value Range   Squamous Epithelial / LPF RARE  RARE   WBC, UA 7-10  <3 WBC/hpf   RBC / HPF 0-2  <3 RBC/hpf   Bacteria, UA MANY (*) RARE   Urine-Other MUCOUS PRESENT    WET PREP, GENITAL     Status: Abnormal   Collection Time    04/12/13 12:36 PM      Result Value Range   Yeast Wet Prep HPF POC NONE SEEN  NONE SEEN   Trich, Wet Prep MODERATE (*) NONE SEEN   Clue Cells Wet Prep HPF  POC MODERATE (*) NONE SEEN   WBC, Wet Prep HPF POC FEW (*) NONE SEEN     ED Course  Procedures   MDM  23 y.o. female with vaginal discharge and suprapubic pain x a few weeks. Concern for STI's. Will treat for trichomonas and UTI. Patient given Rocephin 250 mg IM and Zithromax 1 gram PO here in the ED. Cultures for GC and Chlamydia pending. Encouraged patient to follow  up with the [facility] Department for further testing such as HIV, Hepatitis and Syphilis. Discussed with the patient clinical and lab findings and plan of care. All questioned fully answered. She will return if any problems arise.     Medication List    TAKE these medications       metroNIDAZOLE 500 MG tablet  Commonly known as:  FLAGYL  Take 1 tablet (500 mg total) by mouth 2 (two) times daily.     sulfamethoxazole-trimethoprim 800-160 MG per tablet  Commonly known as:  BACTRIM DS,SEPTRA DS  Take 1 tablet by mouth 2 (two) times daily.      ASK your doctor about these medications       ASACOL 400 MG EC tablet  Generic drug:  mesalamine  Take 400 mg by mouth 3 (three) times daily.     AZO TABS PO  Take by mouth.     ibuprofen 200 MG tablet  Commonly known as:  ADVIL,MOTRIN  Take 200 mg by mouth every 6 (six) hours as needed.     ondansetron 8 MG disintegrating tablet  Commonly known as:  ZOFRAN ODT  Take 1 tablet (8 mg total) by mouth every 8 (eight) hours as needed for nausea.     oxyCODONE-acetaminophen 5-325 MG per tablet  Commonly known as:  PERCOCET/ROXICET  Take 2 tablets by mouth every 4 (four) hours as needed for pain.     predniSONE 10 MG tablet  Commonly known as:  DELTASONE  Take 2 tablets (20 mg total) by mouth daily.           Janne Napoleon, NP 04/12/13 1328

## 2013-04-12 NOTE — ED Notes (Signed)
Abdominal pain and vaginal discharge for a few weeks. She states she may have an STD.

## 2013-04-12 NOTE — Discharge Instructions (Signed)
Be sure your sex partner is treated for the trichomonas infection and that you do not have sex for one week after you have both been treated. Men do not usually have symptoms and you can get reinfected. Follow up with the Health Department for further testing such as HIV, Hepatitis and Syphilis. Return here as needed.

## 2013-04-12 NOTE — ED Provider Notes (Signed)
Medical screening examination/treatment/procedure(s) were performed by non-physician practitioner and as supervising physician I was immediately available for consultation/collaboration.  EKG Interpretation   None         Kristen N Ward, DO 04/12/13 1510 

## 2013-04-13 LAB — URINE CULTURE
Colony Count: NO GROWTH
Culture: NO GROWTH

## 2013-04-13 LAB — GC/CHLAMYDIA PROBE AMP
CT PROBE, AMP APTIMA: NEGATIVE
GC Probe RNA: POSITIVE — AB

## 2013-09-26 ENCOUNTER — Emergency Department (HOSPITAL_BASED_OUTPATIENT_CLINIC_OR_DEPARTMENT_OTHER)
Admission: EM | Admit: 2013-09-26 | Discharge: 2013-09-26 | Disposition: A | Discharge status: Home / Self Care | Payer: 59 | Attending: Emergency Medicine | Admitting: Emergency Medicine

## 2013-09-26 ENCOUNTER — Emergency Department (HOSPITAL_BASED_OUTPATIENT_CLINIC_OR_DEPARTMENT_OTHER): Payer: 59

## 2013-09-26 ENCOUNTER — Encounter (HOSPITAL_BASED_OUTPATIENT_CLINIC_OR_DEPARTMENT_OTHER): Payer: Self-pay | Admitting: Emergency Medicine

## 2013-09-26 DIAGNOSIS — F17200 Nicotine dependence, unspecified, uncomplicated: Secondary | ICD-10-CM | POA: Insufficient documentation

## 2013-09-26 DIAGNOSIS — J40 Bronchitis, not specified as acute or chronic: Secondary | ICD-10-CM

## 2013-09-26 DIAGNOSIS — Z791 Long term (current) use of non-steroidal anti-inflammatories (NSAID): Secondary | ICD-10-CM | POA: Insufficient documentation

## 2013-09-26 DIAGNOSIS — K5289 Other specified noninfective gastroenteritis and colitis: Secondary | ICD-10-CM | POA: Insufficient documentation

## 2013-09-26 DIAGNOSIS — J039 Acute tonsillitis, unspecified: Secondary | ICD-10-CM | POA: Insufficient documentation

## 2013-09-26 DIAGNOSIS — Z79899 Other long term (current) drug therapy: Secondary | ICD-10-CM | POA: Insufficient documentation

## 2013-09-26 DIAGNOSIS — J209 Acute bronchitis, unspecified: Secondary | ICD-10-CM | POA: Insufficient documentation

## 2013-09-26 LAB — RAPID STREP SCREEN (MED CTR MEBANE ONLY): STREPTOCOCCUS, GROUP A SCREEN (DIRECT): NEGATIVE

## 2013-09-26 MED ORDER — HYDROCODONE-ACETAMINOPHEN 5-325 MG PO TABS
2.0000 | ORAL_TABLET | ORAL | Status: DC | PRN
Start: 2013-09-26 — End: 2016-06-02

## 2013-09-26 MED ORDER — AMOXICILLIN 500 MG PO CAPS: 500.0000 mg | ORAL_CAPSULE | Freq: Three times a day (TID) | ORAL | Status: AC

## 2013-09-26 NOTE — Discharge Instructions (Signed)
Bronchitis Bronchitis is inflammation of the airways that extend from the windpipe into the lungs (bronchi). The inflammation often causes mucus to develop, which leads to a cough. If the inflammation becomes severe, it may cause shortness of breath. CAUSES  Bronchitis may be caused by:   Viral infections.   Bacteria.   Cigarette smoke.   Allergens, pollutants, and other irritants.  SIGNS AND SYMPTOMS  The most common symptom of bronchitis is a frequent cough that produces mucus. Other symptoms include:  Fever.   Body aches.   Chest congestion.   Chills.   Shortness of breath.   Sore throat.  DIAGNOSIS  Bronchitis is usually diagnosed through a medical history and physical exam. Tests, such as chest X-rays, are sometimes done to rule out other conditions.  TREATMENT  You may need to avoid contact with whatever caused the problem (smoking, for example). Medicines are sometimes needed. These may include:  Antibiotics. These may be prescribed if the condition is caused by bacteria.  Cough suppressants. These may be prescribed for relief of cough symptoms.   Inhaled medicines. These may be prescribed to help open your airways and make it easier for you to breathe.   Steroid medicines. These may be prescribed for those with recurrent (chronic) bronchitis. HOME CARE INSTRUCTIONS  Get plenty of rest.   Drink enough fluids to keep your urine clear or pale yellow (unless you have a medical condition that requires fluid restriction). Increasing fluids may help thin your secretions and will prevent dehydration.   Only take over-the-counter or prescription medicines as directed by your health care provider.  Only take antibiotics as directed. Make sure you finish them even if you start to feel better.  Avoid secondhand smoke, irritating chemicals, and strong fumes. These will make bronchitis worse. If you are a smoker, quit smoking. Consider using nicotine gum or  skin patches to help control withdrawal symptoms. Quitting smoking will help your lungs heal faster.   Put a cool-mist humidifier in your bedroom at night to moisten the air. This may help loosen mucus. Change the water in the humidifier daily. You can also run the hot water in your shower and sit in the bathroom with the door closed for 5-10 minutes.   Follow up with your health care provider as directed.   Wash your hands frequently to avoid catching bronchitis again or spreading an infection to others.  SEEK MEDICAL CARE IF: Your symptoms do not improve after 1 week of treatment.  SEEK IMMEDIATE MEDICAL CARE IF:  Your fever increases.  You have chills.   You have chest pain.   You have worsening shortness of breath.   You have bloody sputum.  You faint.  You have lightheadedness.  You have a severe headache.   You vomit repeatedly. MAKE SURE YOU:   Understand these instructions.  Will watch your condition.  Will get help right away if you are not doing well or get worse. Document Released: 03/15/2005 Document Revised: 01/03/2013 Document Reviewed: 11/07/2012 [facility] Patient Information 2015 [place], [place]. This information is not intended to replace advice given to you by your health care provider. Make sure you discuss any questions you have with your health care provider. Tonsillitis Tonsillitis is an infection of the throat that causes the tonsils to become red, tender, and swollen. Tonsils are collections of lymphoid tissue at the back of the throat. Each tonsil has crevices (crypts). Tonsils help fight nose and throat infections and keep infection from spreading to other  parts of the body for the first 18 months of life.  CAUSES Sudden (acute) tonsillitis is usually caused by infection with streptococcal bacteria. Long-lasting (chronic) tonsillitis occurs when the crypts of the tonsils become filled with pieces of food and bacteria, which makes it easy for  the tonsils to become repeatedly infected. SYMPTOMS  Symptoms of tonsillitis include:  A sore throat, with possible difficulty swallowing.  White patches on the tonsils.  Fever.  Tiredness.  New episodes of snoring during sleep, when you did not snore before.  Small, foul-smelling, yellowish-white pieces of material (tonsilloliths) that you occasionally cough up or spit out. The tonsilloliths can also cause you to have bad breath. DIAGNOSIS Tonsillitis can be diagnosed through a physical exam. Diagnosis can be confirmed with the results of lab tests, including a throat culture. TREATMENT  The goals of tonsillitis treatment include the reduction of the severity and duration of symptoms and prevention of associated conditions. Symptoms of tonsillitis can be improved with the use of steroids to reduce the swelling. Tonsillitis caused by bacteria can be treated with antibiotic medicines. Usually, treatment with antibiotic medicines is started before the cause of the tonsillitis is known. However, if it is determined that the cause is not bacterial, antibiotic medicines will not treat the tonsillitis. If attacks of tonsillitis are severe and frequent, your health care provider may recommend surgery to remove the tonsils (tonsillectomy). HOME CARE INSTRUCTIONS   Rest as much as possible and get plenty of sleep.  Drink plenty of fluids. While the throat is very sore, eat soft foods or liquids, such as sherbet, soups, or instant breakfast drinks.  Eat frozen ice pops.  Gargle with a warm or cold liquid to help soothe the throat. Mix 1/4 teaspoon of salt and 1/4 teaspoon of baking soda in in 8 oz of water. SEEK MEDICAL CARE IF:   Large, tender lumps develop in your neck.  A rash develops.  A green, yellow-brown, or bloody substance is coughed up.  You are unable to swallow liquids or food for 24 hours.  You notice that only one of the tonsils is swollen. SEEK IMMEDIATE MEDICAL CARE IF:     You develop any new symptoms such as vomiting, severe headache, stiff neck, chest pain, or trouble breathing or swallowing.  You have severe throat pain along with drooling or voice changes.  You have severe pain, unrelieved with recommended medications.  You are unable to fully open the mouth.  You develop redness, swelling, or severe pain anywhere in the neck.  You have a fever. MAKE SURE YOU:   Understand these instructions.  Will watch your condition.  Will get help right away if you are not doing well or get worse. Document Released: 12/23/2004 Document Revised: 03/20/2013 Document Reviewed: 09/01/2012 [facility] Patient Information 2015 [place], [place]. This information is not intended to replace advice given to you by your health care provider. Make sure you discuss any questions you have with your health care provider.

## 2013-09-26 NOTE — ED Provider Notes (Signed)
CSN: [id number]     Arrival date & time 09/26/13  1519 History   First MD Initiated Contact with Patient 09/26/13 1536     Chief Complaint  Patient presents with  . URI     (Consider location/radiation/quality/duration/timing/severity/associated sxs/prior Treatment) Patient is a 23 y.o. female presenting with cough. The history is provided by the patient. No language interpreter was used.  Cough Cough characteristics:  Productive Sputum characteristics:  Nondescript Severity:  Moderate Onset quality:  Gradual Duration:  5 days Timing:  Constant Progression:  Worsening Chronicity:  New Smoker: no   Relieved by:  Nothing Worsened by:  Nothing tried Ineffective treatments:  None tried Associated symptoms: fever and sore throat   Associated symptoms: no chills     Past Medical History  Diagnosis Date  . Colitis    History reviewed. No pertinent past surgical history. History reviewed. No pertinent family history. History  Substance Use Topics  . Smoking status: Current Every Day Smoker -- 0.50 packs/day    Types: Cigarettes  . Smokeless tobacco: Not on file  . Alcohol Use: Yes   OB History   Grav Para Term Preterm Abortions TAB SAB Ect Mult Living                 Review of Systems  Constitutional: Positive for fever. Negative for chills.  HENT: Positive for sore throat.   Respiratory: Positive for cough.   All other systems reviewed and are negative.     Allergies  Review of patient's allergies indicates no known allergies.  Home Medications   Prior to Admission medications   Medication Sig Start Date End Date Taking? Authorizing Provider  ibuprofen (ADVIL,MOTRIN) 200 MG tablet Take 200 mg by mouth every 6 (six) hours as needed.    Historical Provider, MD  mesalamine (ASACOL) 400 MG EC tablet Take 400 mg by mouth 3 (three) times daily.    Historical Provider, MD  Phenazopyridine HCl (AZO TABS PO) Take by mouth.    Historical Provider, MD   BP 116/75   Pulse 108  Temp(Src) 98.6 F (37 C) (Oral)  Resp 18  Ht 5' (1.524 m)  Wt 90 lb (40.824 kg)  BMI 17.58 kg/m2  SpO2 99%  LMP 08/30/2013 Physical Exam  Nursing note and vitals reviewed. Constitutional: She is oriented to person, place, and time. She appears well-developed and well-nourished.  HENT:  Head: Normocephalic.  Erythema throat,    Eyes: EOM are normal. Pupils are equal, round, and reactive to light.  Neck: Normal range of motion.  Cardiovascular: Normal rate and regular rhythm.   Pulmonary/Chest: Effort normal and breath sounds normal.  Abdominal: She exhibits no distension.  Musculoskeletal: Normal range of motion.  Neurological: She is alert and oriented to person, place, and time.  Skin: Skin is warm.  Psychiatric: She has a normal mood and affect.    ED Course  Procedures (including critical care time) Labs Review Labs Reviewed  RAPID STREP SCREEN  CULTURE, GROUP A STREP    Imaging Review Dg Chest 2 View  09/26/2013   CLINICAL DATA:  URI symptoms  EXAM: CHEST  2 VIEW  COMPARISON:  None.  FINDINGS: The lungs are hyperinflated with hemidiaphragm flattening. The heart and mediastinal structures are normal. There is no pleural effusion or pneumothorax. The bony structures are unremarkable.  IMPRESSION: There is hyperinflation consistent with COPD or reactive airway disease. There is no focal pneumonia.   Electronically Signed   By: David  [place]az[place]   On:  09/26/2013 16:29     EKG Interpretation None      MDM   Final diagnoses:  Bronchitis  Tonsillitis    amoxicillian  Hydrocodone Return if any problems.    Lonia SkinnerLeslie K [facility]stownSofia, PA-C 09/26/13 2002

## 2013-09-26 NOTE — ED Notes (Signed)
Pt c/o URi symptoms x 5 days 

## 2013-09-28 LAB — CULTURE, GROUP A STREP

## 2013-10-02 NOTE — ED Provider Notes (Signed)
Medical screening examination/treatment/procedure(s) were performed by non-physician practitioner and as supervising physician I was immediately available for consultation/collaboration.   EKG Interpretation None        Denora Wysocki, MD 10/02/13 0324 

## 2014-02-16 ENCOUNTER — Emergency Department (HOSPITAL_BASED_OUTPATIENT_CLINIC_OR_DEPARTMENT_OTHER): Payer: Medicaid Other

## 2014-02-16 ENCOUNTER — Encounter (HOSPITAL_BASED_OUTPATIENT_CLINIC_OR_DEPARTMENT_OTHER): Payer: Self-pay | Admitting: *Deleted

## 2014-02-16 ENCOUNTER — Emergency Department (HOSPITAL_BASED_OUTPATIENT_CLINIC_OR_DEPARTMENT_OTHER)
Admission: EM | Admit: 2014-02-16 | Discharge: 2014-02-17 | Disposition: A | Discharge status: Other Acute Inpatient Hospital | Payer: Medicaid Other | Attending: Emergency Medicine | Admitting: Emergency Medicine

## 2014-02-16 DIAGNOSIS — Z79899 Other long term (current) drug therapy: Secondary | ICD-10-CM | POA: Diagnosis not present

## 2014-02-16 DIAGNOSIS — Z8619 Personal history of other infectious and parasitic diseases: Secondary | ICD-10-CM | POA: Diagnosis not present

## 2014-02-16 DIAGNOSIS — N898 Other specified noninflammatory disorders of vagina: Secondary | ICD-10-CM | POA: Insufficient documentation

## 2014-02-16 DIAGNOSIS — Z3202 Encounter for pregnancy test, result negative: Secondary | ICD-10-CM | POA: Insufficient documentation

## 2014-02-16 DIAGNOSIS — K51919 Ulcerative colitis, unspecified with unspecified complications: Secondary | ICD-10-CM | POA: Insufficient documentation

## 2014-02-16 DIAGNOSIS — R109 Unspecified abdominal pain: Secondary | ICD-10-CM

## 2014-02-16 DIAGNOSIS — R1030 Lower abdominal pain, unspecified: Secondary | ICD-10-CM | POA: Diagnosis present

## 2014-02-16 DIAGNOSIS — N938 Other specified abnormal uterine and vaginal bleeding: Secondary | ICD-10-CM | POA: Insufficient documentation

## 2014-02-16 DIAGNOSIS — Z720 Tobacco use: Secondary | ICD-10-CM | POA: Insufficient documentation

## 2014-02-16 LAB — CBC WITH DIFFERENTIAL/PLATELET
BASOS ABS: 0 10*3/uL (ref 0.0–0.1)
BASOS PCT: 0 % (ref 0–1)
EOS ABS: 0 10*3/uL (ref 0.0–0.7)
Eosinophils Relative: 0 % (ref 0–5)
HCT: 36.2 % (ref 36.0–46.0)
Hemoglobin: 11.7 g/dL — ABNORMAL LOW (ref 12.0–15.0)
Lymphocytes Relative: 5 % — ABNORMAL LOW (ref 12–46)
Lymphs Abs: 0.6 10*3/uL — ABNORMAL LOW (ref 0.7–4.0)
MCH: 25.9 pg — AB (ref 26.0–34.0)
MCHC: 32.3 g/dL (ref 30.0–36.0)
MCV: 80.3 fL (ref 78.0–100.0)
MONOS PCT: 4 % (ref 3–12)
Monocytes Absolute: 0.5 10*3/uL (ref 0.1–1.0)
NEUTROS ABS: 12.3 10*3/uL — AB (ref 1.7–7.7)
Neutrophils Relative %: 91 % — ABNORMAL HIGH (ref 43–77)
PLATELETS: 280 10*3/uL (ref 150–400)
RBC: 4.51 MIL/uL (ref 3.87–5.11)
RDW: 15.6 % — AB (ref 11.5–15.5)
WBC: 13.5 10*3/uL — ABNORMAL HIGH (ref 4.0–10.5)

## 2014-02-16 LAB — COMPREHENSIVE METABOLIC PANEL
ALT: 8 U/L (ref 0–35)
ANION GAP: 17 — AB (ref 5–15)
AST: 15 U/L (ref 0–37)
Albumin: 3.8 g/dL (ref 3.5–5.2)
Alkaline Phosphatase: 73 U/L (ref 39–117)
BUN: 9 mg/dL (ref 6–23)
CO2: 22 mEq/L (ref 19–32)
Calcium: 9.8 mg/dL (ref 8.4–10.5)
Chloride: 98 mEq/L (ref 96–112)
Creatinine, Ser: 0.7 mg/dL (ref 0.50–1.10)
GFR calc Af Amer: >90 mL/min (ref >90)
GFR calc non Af Amer: >90 mL/min (ref >90)
Glucose, Bld: 104 mg/dL — ABNORMAL HIGH (ref 70–99)
Potassium: 3.3 mEq/L — ABNORMAL LOW (ref 3.7–5.3)
SODIUM: 137 meq/L (ref 137–147)
TOTAL PROTEIN: 8.9 g/dL — AB (ref 6.0–8.3)
Total Bilirubin: 0.6 mg/dL (ref 0.3–1.2)

## 2014-02-16 LAB — PREGNANCY, URINE: PREG TEST UR: NEGATIVE

## 2014-02-16 LAB — I-STAT CG4 LACTIC ACID, ED: Lactic Acid, Venous: 1.67 mmol/L (ref 0.5–2.2)

## 2014-02-16 LAB — WET PREP, GENITAL: YEAST WET PREP: NONE SEEN

## 2014-02-16 MED ORDER — POTASSIUM CHLORIDE CRYS ER 20 MEQ PO TBCR
40.0000 meq | EXTENDED_RELEASE_TABLET | Freq: Once | ORAL | Status: AC
  Administered 2014-02-16: 40 meq via ORAL

## 2014-02-16 MED ORDER — METRONIDAZOLE IN NACL 5-0.79 MG/ML-% IV SOLN
500.0000 mg | Freq: Once | INTRAVENOUS | Status: AC
  Administered 2014-02-17: 500 mg via INTRAVENOUS
  Filled 2014-02-16: qty 100

## 2014-02-16 MED ORDER — SODIUM CHLORIDE 0.9 % IV BOLUS (SEPSIS)
2000.0000 mL | Freq: Once | INTRAVENOUS | Status: AC
  Administered 2014-02-16: 2000 mL via INTRAVENOUS

## 2014-02-16 MED ORDER — SODIUM CHLORIDE 0.9 % IV SOLN
INTRAVENOUS | Status: DC
  Administered 2014-02-16: 21:00:00 via INTRAVENOUS

## 2014-02-16 MED ORDER — ACETAMINOPHEN 325 MG PO TABS
650.0000 mg | ORAL_TABLET | Freq: Once | ORAL | Status: AC
  Administered 2014-02-16: 650 mg via ORAL
  Filled 2014-02-16: qty 2

## 2014-02-16 MED ORDER — IOHEXOL 300 MG/ML  SOLN
80.0000 mL | Freq: Once | INTRAMUSCULAR | Status: AC | PRN
  Administered 2014-02-16: 80 mL via INTRAVENOUS

## 2014-02-16 MED ORDER — DEXTROSE 5 % IV SOLN
2.0000 g | Freq: Once | INTRAVENOUS | Status: AC
  Administered 2014-02-17: 2 g via INTRAVENOUS
  Filled 2014-02-16: qty 2

## 2014-02-16 MED ORDER — DOXYCYCLINE HYCLATE 100 MG PO TABS
100.0000 mg | ORAL_TABLET | Freq: Once | ORAL | Status: AC
  Administered 2014-02-16: 100 mg via ORAL
  Filled 2014-02-16: qty 1

## 2014-02-16 MED ORDER — POTASSIUM CHLORIDE CRYS ER 20 MEQ PO TBCR
EXTENDED_RELEASE_TABLET | ORAL | Status: AC
  Filled 2014-02-16: qty 2

## 2014-02-16 MED ORDER — DEXTROSE 5 % IV SOLN
2.0000 g | Freq: Two times a day (BID) | INTRAVENOUS | Status: DC
  Filled 2014-02-16: qty 2

## 2014-02-16 MED ORDER — IOHEXOL 300 MG/ML  SOLN
50.0000 mL | Freq: Once | INTRAMUSCULAR | Status: AC | PRN
  Administered 2014-02-16: 50 mL via ORAL

## 2014-02-16 NOTE — ED Notes (Addendum)
Reports abdominal pain and heavy vaginal bleeding x 2 days. Reports fever last night and possible exposure to gonorrhea

## 2014-02-16 NOTE — ED Provider Notes (Signed)
CSN: [id number]     Arrival date & time 02/16/14  1904 History  This chart was scribed for Amanda BakerAnthony T Jaiquan Temme, MD by Modena JanskyAlbert Thayil, ED Scribe. This patient was seen in room MH09/MH09 and the patient's care was started at 7:31 PM.   Chief Complaint  Patient presents with  . Abdominal Pain   The history is provided by the patient. No language interpreter was used.    HPI Comments: Amanda Dominguez is a 23 y.o. female who presents to the Emergency Department complaining of constant moderate lower abdominal pain that started last night. She reports that she is unsure if she has an STD. She states that she recently started her menstrual cycle with heavy bleeding and had thick white vaginal discharge before it started. She reports that she had a subjective fever last night. Pt's temperature in the ED today is 101.1. She states that she has a hx of gonorrhea and pregnancy. She reports no modifying factors for symptoms. She states that she had no treatment PTA. She denies any dysuria, emesis, or diarrhea. She also denies any recent cough or congestion.Symptoms persistent and nothing makes them better or worse.   Past Medical History  Diagnosis Date  . Colitis    History reviewed. No pertinent past surgical history. No family history on file. History  Substance Use Topics  . Smoking status: Current Every Day Smoker -- 0.50 packs/day    Types: Cigarettes  . Smokeless tobacco: Not on file  . Alcohol Use: Yes   OB History    No data available     Review of Systems  Constitutional: Positive for fever.  HENT: Negative for congestion.   Gastrointestinal: Positive for abdominal pain. Negative for diarrhea.  Genitourinary: Positive for vaginal bleeding and vaginal discharge. Negative for dysuria.  All other systems reviewed and are negative.  Allergies  Review of patient's allergies indicates no known allergies.  Home Medications   Prior to Admission medications   Medication Sig Start Date End  Date Taking? Authorizing Provider  HYDROcodone-acetaminophen (NORCO/VICODIN) 5-325 MG per tablet Take 2 tablets by mouth every 4 (four) hours as needed. 09/26/13   Elson AreasLeslie K Sofia, PA-C  ibuprofen (ADVIL,MOTRIN) 200 MG tablet Take 200 mg by mouth every 6 (six) hours as needed.    Historical Provider, MD  mesalamine (ASACOL) 400 MG EC tablet Take 400 mg by mouth 3 (three) times daily.    Historical Provider, MD  Phenazopyridine HCl (AZO TABS PO) Take by mouth.    Historical Provider, MD   BP 116/76 mmHg  Pulse 136  Temp(Src) 101.1 F (38.4 C) (Oral)  Resp 22  Ht 5\' 1"  (1.549 m)  Wt 83 lb (37.649 kg)  BMI 15.69 kg/m2  SpO2 98%  LMP 02/01/2014 Physical Exam  Constitutional: She is oriented to person, place, and time. She appears well-developed and well-nourished.  Non-toxic appearance. No distress.  HENT:  Head: Normocephalic and atraumatic.  Eyes: Conjunctivae, EOM and lids are normal. Pupils are equal, round, and reactive to light.  Neck: Normal range of motion. Neck supple. No tracheal deviation present. No thyroid mass present.  Cardiovascular: Normal rate, regular rhythm and normal heart sounds.  Exam reveals no gallop.   No murmur heard. Pulmonary/Chest: Effort normal and breath sounds normal. No stridor. No respiratory distress. She has no decreased breath sounds. She has no wheezes. She has no rhonchi. She has no rales.  Abdominal: Soft. Normal appearance and bowel sounds are normal. She exhibits no distension. There is no  tenderness. There is no rebound and no CVA tenderness.  TTP bilateral lower quadrants with no guarding or rebound.   Genitourinary: There is no rash on the right labia. There is no rash on the left labia. Cervix exhibits no motion tenderness. There is bleeding in the vagina.  Musculoskeletal: Normal range of motion. She exhibits no edema or tenderness.  Neurological: She is alert and oriented to person, place, and time. She has normal strength. No cranial nerve  deficit or sensory deficit. GCS eye subscore is 4. GCS verbal subscore is 5. GCS motor subscore is 6.  Skin: Skin is warm and dry. No abrasion and no rash noted.  Psychiatric: She has a normal mood and affect. Her speech is normal and behavior is normal.  Nursing note and vitals reviewed.   ED Course  Procedures (including critical care time) DIAGNOSTIC STUDIES: Oxygen Saturation is 98% on RA, normal by my interpretation.    COORDINATION OF CARE: 7:35 PM- Pt advised of plan for treatment which includes medication, radiology, and labs and pt agrees.  Labs Review Labs Reviewed  WET PREP, GENITAL - Abnormal; Notable for the following:    Trich, Wet Prep FEW (*)    Clue Cells Wet Prep HPF POC FEW (*)    WBC, Wet Prep HPF POC MANY (*)    All other components within normal limits  CBC WITH DIFFERENTIAL - Abnormal; Notable for the following:    WBC 13.5 (*)    Hemoglobin 11.7 (*)    MCH 25.9 (*)    RDW 15.6 (*)    Neutrophils Relative % 91 (*)    Neutro Abs 12.3 (*)    Lymphocytes Relative 5 (*)    Lymphs Abs 0.6 (*)    All other components within normal limits  COMPREHENSIVE METABOLIC PANEL - Abnormal; Notable for the following:    Potassium 3.3 (*)    Glucose, Bld 104 (*)    Total Protein 8.9 (*)    Anion gap 17 (*)    All other components within normal limits  GC/CHLAMYDIA PROBE AMP  CULTURE, BLOOD (ROUTINE X 2)  CULTURE, BLOOD (ROUTINE X 2)  PREGNANCY, URINE  HIV ANTIBODY (ROUTINE TESTING)  RPR  I-STAT CG4 LACTIC ACID, ED    Imaging Review [place] Transvaginal Non-ob  02/16/2014   CLINICAL DATA:  Diffuse pelvic pain with heavy vaginal bleeding for 1 day. History of ulcerative colitis. Possible recent STD exposure. Initial encounter.  EXAM: TRANSABDOMINAL AND TRANSVAGINAL ULTRASOUND OF PELVIS  TECHNIQUE: Both transabdominal and transvaginal ultrasound examinations of the pelvis were performed. Transabdominal technique was performed for global imaging of the pelvis including  uterus, ovaries, adnexal regions, and pelvic cul-de-sac. It was necessary to proceed with endovaginal exam following the transabdominal exam to visualize the endometrium and ovaries.  COMPARISON:  Abdominal pelvic CT 12/29/2012  FINDINGS: Uterus  Measurements: 9.1 x 4.1 x 5.5 cm. The myometrium is mildly heterogeneous without focal lesion.  Endometrium  Thickness: 6.3 mm. There is probable blood clot within the endometrial canal in this patient who is actively bleeding. No focal endometrial lesion identified.  Right ovary  Measurements: 3.3 x 1.3 x 2.4 cm. No evidence of adnexal mass. There are probable prominent venous collaterals adjacent to the ovary.  Left ovary  Measurements: 3.3 x 1.7 x 1.9 cm. No evidence of adnexal mass. There are probable prominent venous collaterals adjacent to the ovary.  Other findings  No free fluid.  IMPRESSION: 1. No significant thickening of the endometrium. Mild endometrial  heterogeneity with probable blood clot in the endometrial canal. 2. Both ovaries appear normal. There are probable adjacent venous collaterals or chronic inflammation as correlated with prior CT.   Electronically Signed   By: Roxy Horseman M.D.   On: 02/16/2014 20:55   US Pelvis Complete  02/16/2014   CLINICAL DATA:  Diffuse pelvic pain with heavy vaginal bleeding for 1 day. History of ulcerative colitis. Possible recent STD exposure. Initial encounter.  EXAM: TRANSABDOMINAL AND TRANSVAGINAL ULTRASOUND OF PELVIS  TECHNIQUE: Both transabdominal and transvaginal ultrasound examinations of the pelvis were performed. Transabdominal technique was performed for global imaging of the pelvis including uterus, ovaries, adnexal regions, and pelvic cul-de-sac. It was necessary to proceed with endovaginal exam following the transabdominal exam to visualize the endometrium and ovaries.  COMPARISON:  Abdominal pelvic CT 12/29/2012  FINDINGS: Uterus  Measurements: 9.1 x 4.1 x 5.5 cm. The myometrium is mildly heterogeneous  without focal lesion.  Endometrium  Thickness: 6.3 mm. There is probable blood clot within the endometrial canal in this patient who is actively bleeding. No focal endometrial lesion identified.  Right ovary  Measurements: 3.3 x 1.3 x 2.4 cm. No evidence of adnexal mass. There are probable prominent venous collaterals adjacent to the ovary.  Left ovary  Measurements: 3.3 x 1.7 x 1.9 cm. No evidence of adnexal mass. There are probable prominent venous collaterals adjacent to the ovary.  Other findings  No free fluid.  IMPRESSION: 1. No significant thickening of the endometrium. Mild endometrial heterogeneity with probable blood clot in the endometrial canal. 2. Both ovaries appear normal. There are probable adjacent venous collaterals or chronic inflammation as correlated with prior CT.   Electronically Signed   By: Roxy Horseman M.D.   On: 02/16/2014 20:55     EKG Interpretation None      MDM   Final diagnoses:  Abdominal pain    I personally performed the services described in this documentation, which was scribed in my presence. The recorded information has been reviewed and is accurate.    11:51 PM Patient started on antibiotics for possible PID versus ulcerative colitis exacerbation. Pain meds given here as well 2. We'll admit to hospitalist  Amanda Baker, MD 02/16/14 2352

## 2014-02-16 NOTE — ED Notes (Signed)
Dr Freida BusmanAllen notified of pt's HR

## 2014-02-17 DIAGNOSIS — Z3202 Encounter for pregnancy test, result negative: Secondary | ICD-10-CM | POA: Diagnosis not present

## 2014-02-17 DIAGNOSIS — Z8619 Personal history of other infectious and parasitic diseases: Secondary | ICD-10-CM | POA: Diagnosis not present

## 2014-02-17 DIAGNOSIS — N898 Other specified noninflammatory disorders of vagina: Secondary | ICD-10-CM | POA: Diagnosis not present

## 2014-02-17 DIAGNOSIS — R1030 Lower abdominal pain, unspecified: Secondary | ICD-10-CM | POA: Diagnosis present

## 2014-02-17 DIAGNOSIS — N938 Other specified abnormal uterine and vaginal bleeding: Secondary | ICD-10-CM | POA: Diagnosis not present

## 2014-02-17 DIAGNOSIS — Z79899 Other long term (current) drug therapy: Secondary | ICD-10-CM | POA: Diagnosis not present

## 2014-02-17 DIAGNOSIS — Z720 Tobacco use: Secondary | ICD-10-CM | POA: Diagnosis not present

## 2014-02-17 DIAGNOSIS — K51919 Ulcerative colitis, unspecified with unspecified complications: Secondary | ICD-10-CM | POA: Diagnosis not present

## 2014-02-17 LAB — HIV ANTIBODY (ROUTINE TESTING W REFLEX): HIV 1&2 Ab, 4th Generation: NONREACTIVE

## 2014-02-17 LAB — RPR

## 2014-02-17 MED ORDER — ONDANSETRON HCL 4 MG/2ML IJ SOLN
4.0000 mg | Freq: Once | INTRAMUSCULAR | Status: AC
  Administered 2014-02-17: 4 mg via INTRAVENOUS

## 2014-02-17 MED ORDER — MORPHINE SULFATE 2 MG/ML IJ SOLN
2.0000 mg | Freq: Once | INTRAMUSCULAR | Status: AC
  Administered 2014-02-17: 2 mg via INTRAVENOUS

## 2014-02-17 MED ORDER — ONDANSETRON HCL 4 MG/2ML IJ SOLN: 4.0000 mg | Freq: Once | INTRAMUSCULAR | Status: DC

## 2014-02-17 MED ORDER — ONDANSETRON HCL 4 MG/2ML IJ SOLN
INTRAMUSCULAR | Status: AC
  Filled 2014-02-17: qty 2

## 2014-02-17 MED ORDER — MORPHINE SULFATE 2 MG/ML IJ SOLN
2.0000 mg | Freq: Once | INTRAMUSCULAR | Status: AC
  Administered 2014-02-17: 2 mg via INTRAVENOUS
  Filled 2014-02-17: qty 1

## 2014-02-17 MED ORDER — MORPHINE SULFATE 2 MG/ML IJ SOLN
INTRAMUSCULAR | Status: AC
  Administered 2014-02-17: 2 mg via INTRAVENOUS
  Filled 2014-02-17: qty 1

## 2014-02-17 NOTE — ED Notes (Signed)
[facility].

## 2014-02-17 NOTE — ED Notes (Signed)
mefoxin nearly infused Flagyl to infuse following this medication

## 2014-02-18 MED FILL — Fentanyl Citrate Inj 0.05 MG/ML: INTRAMUSCULAR | Qty: 2 | Status: AC

## 2014-02-19 LAB — GC/CHLAMYDIA PROBE AMP
CT PROBE, AMP APTIMA: NEGATIVE
GC Probe RNA: POSITIVE — AB

## 2014-02-20 ENCOUNTER — Telehealth (HOSPITAL_BASED_OUTPATIENT_CLINIC_OR_DEPARTMENT_OTHER): Payer: Self-pay | Admitting: *Deleted

## 2014-02-23 LAB — CULTURE, BLOOD (ROUTINE X 2)
CULTURE: NO GROWTH
Culture: NO GROWTH

## 2015-04-06 ENCOUNTER — Emergency Department (HOSPITAL_BASED_OUTPATIENT_CLINIC_OR_DEPARTMENT_OTHER)
Admission: EM | Admit: 2015-04-06 | Discharge: 2015-04-06 | Disposition: A | Discharge status: Home / Self Care | Payer: Medicaid Other | Attending: Emergency Medicine | Admitting: Emergency Medicine

## 2015-04-06 ENCOUNTER — Encounter (HOSPITAL_BASED_OUTPATIENT_CLINIC_OR_DEPARTMENT_OTHER): Payer: Self-pay | Admitting: *Deleted

## 2015-04-06 ENCOUNTER — Emergency Department (HOSPITAL_BASED_OUTPATIENT_CLINIC_OR_DEPARTMENT_OTHER): Payer: Medicaid Other

## 2015-04-06 DIAGNOSIS — R69 Illness, unspecified: Secondary | ICD-10-CM

## 2015-04-06 DIAGNOSIS — J111 Influenza due to unidentified influenza virus with other respiratory manifestations: Secondary | ICD-10-CM | POA: Insufficient documentation

## 2015-04-06 DIAGNOSIS — F17210 Nicotine dependence, cigarettes, uncomplicated: Secondary | ICD-10-CM | POA: Insufficient documentation

## 2015-04-06 DIAGNOSIS — Z8719 Personal history of other diseases of the digestive system: Secondary | ICD-10-CM | POA: Insufficient documentation

## 2015-04-06 DIAGNOSIS — Z79899 Other long term (current) drug therapy: Secondary | ICD-10-CM | POA: Insufficient documentation

## 2015-04-06 DIAGNOSIS — R110 Nausea: Secondary | ICD-10-CM | POA: Insufficient documentation

## 2015-04-06 MED ORDER — BENZONATATE 100 MG PO CAPS: 100.0000 mg | ORAL_CAPSULE | Freq: Three times a day (TID) | ORAL | Status: DC

## 2015-04-06 MED ORDER — ACETAMINOPHEN 500 MG PO TABS
1000.0000 mg | ORAL_TABLET | Freq: Once | ORAL | Status: AC
  Administered 2015-04-06: 1000 mg via ORAL
  Filled 2015-04-06: qty 2

## 2015-04-06 MED ORDER — OSELTAMIVIR PHOSPHATE 75 MG PO CAPS: 75.0000 mg | ORAL_CAPSULE | Freq: Two times a day (BID) | ORAL | Status: DC

## 2015-04-06 MED ORDER — BENZONATATE 100 MG PO CAPS
100.0000 mg | ORAL_CAPSULE | Freq: Once | ORAL | Status: AC
  Administered 2015-04-06: 100 mg via ORAL
  Filled 2015-04-06: qty 1

## 2015-04-06 NOTE — ED Provider Notes (Addendum)
CSN: [id number]     Arrival date & time 04/06/15  1709 History   First MD Initiated Contact with Patient 04/06/15 1942     Chief Complaint  Patient presents with  . Cough     (Consider location/radiation/quality/duration/timing/severity/associated sxs/prior Treatment) Patient is a 25 y.o. female presenting with cough and general illness. The history is provided by the patient.  Cough Associated symptoms: myalgias   Associated symptoms: no chest pain, no chills, no fever, no headaches, no rhinorrhea, no shortness of breath and no wheezing   Illness Severity:  Moderate Onset quality:  Sudden Duration:  2 days Timing:  Constant Progression:  Worsening Chronicity:  New Associated symptoms: congestion, cough, myalgias and nausea   Associated symptoms: no chest pain, no fever, no headaches, no rhinorrhea, no shortness of breath, no vomiting and no wheezing     25 yo F with a chief complaint of cough myalgias nausea. This been going on for the past 2 days. Patient denies fevers. Patient has a significant past medical history of ulcerative colitis and is on immunosuppressive therapy. Patient denies any abdominal pain diarrhea dark stools.  Past Medical History  Diagnosis Date  . Colitis    History reviewed. No pertinent past surgical history. No family history on file. Social History  Substance Use Topics  . Smoking status: Current Every Day Smoker -- 0.50 packs/day    Types: Cigarettes  . Smokeless tobacco: Never Used  . Alcohol Use: No   OB History    No data available     Review of Systems  Constitutional: Negative for fever and chills.  HENT: Positive for congestion. Negative for rhinorrhea.   Eyes: Negative for redness and visual disturbance.  Respiratory: Positive for cough. Negative for shortness of breath and wheezing.   Cardiovascular: Negative for chest pain and palpitations.  Gastrointestinal: Positive for nausea. Negative for vomiting.  Genitourinary: Negative  for dysuria and urgency.  Musculoskeletal: Positive for myalgias. Negative for arthralgias.  Skin: Negative for pallor and wound.  Neurological: Negative for dizziness and headaches.      Allergies  Review of patient's allergies indicates no known allergies.  Home Medications   Prior to Admission medications   Medication Sig Start Date End Date Taking? Authorizing Provider  mesalamine (LIALDA) 1.2 g EC tablet Take by mouth daily with breakfast.   Yes Historical Provider, MD  benzonatate (TESSALON) 100 MG capsule Take 1 capsule (100 mg total) by mouth every 8 (eight) hours. 04/06/15   Melene Planan Jaelah Hauth, DO  HYDROcodone-acetaminophen (NORCO/VICODIN) 5-325 MG per tablet Take 2 tablets by mouth every 4 (four) hours as needed. 09/26/13   Elson AreasLeslie K Sofia, PA-C  ibuprofen (ADVIL,MOTRIN) 200 MG tablet Take 200 mg by mouth every 6 (six) hours as needed.    Historical Provider, MD  mesalamine (ASACOL) 400 MG EC tablet Take 400 mg by mouth 3 (three) times daily.    Historical Provider, MD  oseltamivir (TAMIFLU) 75 MG capsule Take 1 capsule (75 mg total) by mouth every 12 (twelve) hours. 04/06/15   Melene Planan Kalynn Declercq, DO  Phenazopyridine HCl (AZO TABS PO) Take by mouth.    Historical Provider, MD   BP 94/72 mmHg  Pulse 90  Temp(Src) 98.6 F (37 C) (Oral)  Resp 16  Ht 5\' 1"  (1.549 m)  Wt 90 lb (40.824 kg)  BMI 17.01 kg/m2  SpO2 98%  LMP 03/31/2015 Physical Exam  Constitutional: She is oriented to person, place, and time. She appears well-developed and well-nourished. No distress.  HENT:  Head: Normocephalic and atraumatic.  Swollen turbinates  Eyes: EOM are normal. Pupils are equal, round, and reactive to light.  Neck: Normal range of motion. Neck supple.  Cardiovascular: Normal rate and regular rhythm.  Exam reveals no gallop and no friction rub.   No murmur heard. Pulmonary/Chest: Effort normal. She has no wheezes. She has no rales.  Abdominal: Soft. She exhibits no distension. There is no tenderness. There  is no rebound and no guarding.  Musculoskeletal: She exhibits no edema or tenderness.  Neurological: She is alert and oriented to person, place, and time.  Skin: Skin is warm and dry. She is not diaphoretic.  Psychiatric: She has a normal mood and affect. Her behavior is normal.  Nursing note and vitals reviewed.   ED Course  Procedures (including critical care time) Labs Review Labs Reviewed - No data to display  Imaging Review Dg Chest 2 View  04/06/2015  CLINICAL DATA:  Cough for 2 days.  Smoker. EXAM: CHEST  2 VIEW COMPARISON:  PA and lateral chest 09/26/2013. FINDINGS: The chest is hyperexpanded but the lungs are clear. Heart size is normal. No pneumothorax or pleural effusion. No focal bony abnormality. IMPRESSION: Pulmonary hyperexpansion, unchanged.  No acute disease. Electronically Signed   By: Drusilla Kanner M.D.   On: 04/06/2015 20:20   I have personally reviewed and evaluated these images and lab results as part of my medical decision-making.   EKG Interpretation None      MDM   Final diagnoses:  Influenza-like illness    25 yo F with a chief complaint of cough congestion fever myalgias. Consistent with a viral syndrome likely influenza. Chest x-ray is negative for pneumonia. Discussed with patient risks and benefits of Tamiflu as she is on immunosuppression. We'll have her follow with her family doctor in 2 days. NSAIDs Tylenol for relief.  9:02 PM:  I have discussed the diagnosis/risks/treatment options with the patient and believe the pt to be eligible for discharge home to follow-up with PCP. We also discussed returning to the ED immediately if new or worsening sx occur. We discussed the sx which are most concerning (e.g., sudden worsening pain, fever, inability to tolerate by mouth) that necessitate immediate return. Medications administered to the patient during their visit and any new prescriptions provided to the patient are listed below.  Medications given  during this visit Medications  benzonatate (TESSALON) capsule 100 mg (100 mg Oral Given 04/06/15 1959)  acetaminophen (TYLENOL) tablet 1,000 mg (1,000 mg Oral Given 04/06/15 1959)    New Prescriptions   BENZONATATE (TESSALON) 100 MG CAPSULE    Take 1 capsule (100 mg total) by mouth every 8 (eight) hours.   OSELTAMIVIR (TAMIFLU) 75 MG CAPSULE    Take 1 capsule (75 mg total) by mouth every 12 (twelve) hours.    The patient appears reasonably screen and/or stabilized for discharge and I doubt any other medical condition or other [facility] requiring further screening, evaluation, or treatment in the ED at this time prior to discharge.      Melene Plan, DO 04/06/15 2102  Melene Plan, DO 04/06/15 2103

## 2015-04-06 NOTE — ED Notes (Signed)
Cough x 2 days denies fever

## 2015-04-06 NOTE — Discharge Instructions (Signed)
Take 4 over the counter ibuprofen tablets 3 times a day or 2 over-the-counter naproxen tablets twice a day for pain. ° °Influenza, Adult °Influenza ("the flu") is a viral infection of the respiratory tract. It occurs more often in winter months because people spend more time in close contact with one another. Influenza can make you feel very sick. Influenza easily spreads from person to person (contagious). °CAUSES  °Influenza is caused by a virus that infects the respiratory tract. You can catch the virus by breathing in droplets from an infected person's cough or sneeze. You can also catch the virus by touching something that was recently contaminated with the virus and then touching your mouth, nose, or eyes. °RISKS AND COMPLICATIONS °You may be at risk for a more severe case of influenza if you smoke cigarettes, have diabetes, have chronic heart disease (such as heart failure) or lung disease (such as asthma), or if you have a weakened immune system. Elderly people and pregnant women are also at risk for more serious infections. The most common problem of influenza is a lung infection (pneumonia). Sometimes, this problem can require emergency medical care and may be life threatening. °SIGNS AND SYMPTOMS  °Symptoms typically last 4 to 10 days and may include: °· Fever. °· Chills. °· Headache, body aches, and muscle aches. °· Sore throat. °· Chest discomfort and cough. °· Poor appetite. °· Weakness or feeling tired. °· Dizziness. °· Nausea or vomiting. °DIAGNOSIS  °Diagnosis of influenza is often made based on your history and a physical exam. A nose or throat swab test can be done to confirm the diagnosis. °TREATMENT  °In mild cases, influenza goes away on its own. Treatment is directed at relieving symptoms. For more severe cases, your health care provider may prescribe antiviral medicines to shorten the sickness. Antibiotic medicines are not effective because the infection is caused by a virus, not by  bacteria. °HOME CARE INSTRUCTIONS °· Take medicines only as directed by your health care provider. °· Use a cool mist humidifier to make breathing easier. °· Get plenty of rest until your temperature returns to normal. This usually takes 3 to 4 days. °· Drink enough fluid to keep your urine clear or pale yellow. °· Cover your mouth and nose when coughing or sneezing, and wash your hands well to prevent the virus from spreading. °· Stay home from work or school until the fever is gone for at least 1 full day. °PREVENTION  °An annual influenza vaccination (flu shot) is the best way to avoid getting influenza. An annual flu shot is now routinely recommended for all adults in the [place] °SEEK MEDICAL CARE IF: °· You experience chest pain, your cough worsens, or you produce more mucus. °· You have nausea, vomiting, or diarrhea. °· Your fever returns or gets worse. °SEEK IMMEDIATE MEDICAL CARE IF: °· You have trouble breathing, you become short of breath, or your skin or nails become bluish. °· You have severe pain or stiffness in the neck. °· You develop a sudden headache, or pain in the face or ear. °· You have nausea or vomiting that you cannot control. °MAKE SURE YOU:  °· Understand these instructions. °· Will watch your condition. °· Will get help right away if you are not doing well or get worse. °  °This information is not intended to replace advice given to you by your health care provider. Make sure you discuss any questions you have with your health care provider. °  °Document Released: 03/12/2000 Document Revised: 04/05/2014 Document Reviewed: 06/14/2011 °[employer] Interactive Patient Education ©2016 [employer] ° °

## 2015-04-22 ENCOUNTER — Encounter (HOSPITAL_BASED_OUTPATIENT_CLINIC_OR_DEPARTMENT_OTHER): Payer: Self-pay

## 2015-04-22 ENCOUNTER — Emergency Department (HOSPITAL_BASED_OUTPATIENT_CLINIC_OR_DEPARTMENT_OTHER)
Admission: EM | Admit: 2015-04-22 | Discharge: 2015-04-22 | Disposition: A | Discharge status: Home / Self Care | Payer: Medicaid Other | Attending: Emergency Medicine | Admitting: Emergency Medicine

## 2015-04-22 DIAGNOSIS — F419 Anxiety disorder, unspecified: Secondary | ICD-10-CM | POA: Insufficient documentation

## 2015-04-22 DIAGNOSIS — Z79899 Other long term (current) drug therapy: Secondary | ICD-10-CM | POA: Insufficient documentation

## 2015-04-22 DIAGNOSIS — R0602 Shortness of breath: Secondary | ICD-10-CM | POA: Insufficient documentation

## 2015-04-22 DIAGNOSIS — Z8719 Personal history of other diseases of the digestive system: Secondary | ICD-10-CM | POA: Insufficient documentation

## 2015-04-22 DIAGNOSIS — R0789 Other chest pain: Secondary | ICD-10-CM | POA: Insufficient documentation

## 2015-04-22 DIAGNOSIS — F17210 Nicotine dependence, cigarettes, uncomplicated: Secondary | ICD-10-CM | POA: Insufficient documentation

## 2015-04-22 HISTORY — DX: Anxiety disorder, unspecified: F41.9

## 2015-04-22 MED ORDER — LORAZEPAM 1 MG PO TABS: 1.0000 mg | ORAL_TABLET | Freq: Four times a day (QID) | ORAL | Status: DC | PRN

## 2015-04-22 NOTE — ED Provider Notes (Signed)
CSN: [id number]     Arrival date & time 04/22/15  0043 History   First MD Initiated Contact with Patient 04/22/15 0116     Chief Complaint  Patient presents with  . Anxiety     (Consider location/radiation/quality/duration/timing/severity/associated sxs/prior Treatment) HPI Comments: Patient is a 25 year old female who presents with complaints of chest tightness, shortness of breath, anxiety. She had a bad night at work this evening and presents for evaluation of this. She denies any fevers or chills. She reports a history of anxiety, however his never been treated for this.  Patient is a 25 y.o. female presenting with anxiety. The history is provided by the patient.  Anxiety This is a new problem. The current episode started 1 to 2 hours ago. The problem occurs constantly. The problem has been gradually worsening. Associated symptoms include chest pain and shortness of breath. Nothing aggravates the symptoms. Nothing relieves the symptoms. She has tried nothing for the symptoms. The treatment provided no relief.    Past Medical History  Diagnosis Date  . Colitis   . Anxiety    History reviewed. No pertinent past surgical history. No family history on file. Social History  Substance Use Topics  . Smoking status: Current Every Day Smoker -- 0.50 packs/day    Types: Cigarettes  . Smokeless tobacco: Never Used  . Alcohol Use: No   OB History    No data available     Review of Systems  Respiratory: Positive for shortness of breath.   Cardiovascular: Positive for chest pain.  All other systems reviewed and are negative.     Allergies  Review of patient's allergies indicates no known allergies.  Home Medications   Prior to Admission medications   Medication Sig Start Date End Date Taking? Authorizing Provider  mesalamine (ASACOL) 400 MG EC tablet Take 400 mg by mouth 3 (three) times daily.   Yes Historical Provider, MD  mesalamine (LIALDA) 1.2 g EC tablet Take by mouth  daily with breakfast.   Yes Historical Provider, MD  benzonatate (TESSALON) 100 MG capsule Take 1 capsule (100 mg total) by mouth every 8 (eight) hours. 04/06/15   Melene Plan, DO  HYDROcodone-acetaminophen (NORCO/VICODIN) 5-325 MG per tablet Take 2 tablets by mouth every 4 (four) hours as needed. 09/26/13   Elson Areas, PA-C  ibuprofen (ADVIL,MOTRIN) 200 MG tablet Take 200 mg by mouth every 6 (six) hours as needed.    Historical Provider, MD  oseltamivir (TAMIFLU) 75 MG capsule Take 1 capsule (75 mg total) by mouth every 12 (twelve) hours. 04/06/15   Melene Plan, DO  Phenazopyridine HCl (AZO TABS PO) Take by mouth.    Historical Provider, MD   BP 130/78 mmHg  Pulse 98  Temp(Src) 99.7 F (37.6 C) (Oral)  Resp 18  Ht 5' (1.524 m)  Wt 90 lb (40.824 kg)  BMI 17.58 kg/m2  SpO2 98%  LMP 03/31/2015 (Exact Date) Physical Exam  Constitutional: She is oriented to person, place, and time. She appears well-developed and well-nourished. No distress.  HENT:  Head: Normocephalic and atraumatic.  Neck: Normal range of motion. Neck supple.  Cardiovascular: Normal rate and regular rhythm.  Exam reveals no gallop and no friction rub.   No murmur heard. Pulmonary/Chest: Effort normal and breath sounds normal. No respiratory distress. She has no wheezes. She has no rales.  Abdominal: Soft. Bowel sounds are normal. She exhibits no distension. There is no tenderness.  Musculoskeletal: Normal range of motion. She exhibits no edema.  There  is no calf tenderness or swelling. Homans sign is absent bilaterally.  Neurological: She is alert and oriented to person, place, and time.  Skin: Skin is warm and dry. She is not diaphoretic.  Nursing note and vitals reviewed.   ED Course  Procedures (including critical care time) Labs Review Labs Reviewed - No data to display  Imaging Review No results found. I have personally reviewed and evaluated these images and lab results as part of my medical decision-making.    EKG Interpretation   Date/Time:  Tuesday April 22 2015 00:53:24 EST Ventricular Rate:  98 PR Interval:  146 QRS Duration: 101 QT Interval:  356 QTC Calculation: 454 R Axis:   70 Text Interpretation:  Sinus rhythm Right atrial enlargement Consider right  ventricular hypertrophy Confirmed by Hue Frick  MD, Nakeem Murnane ([id number]) on  04/22/2015 1:25:30 AM      MDM   Final diagnoses:  None    Patient presents with complaints of anxiety and tightness in her chest. She is not tachycardic and there is no hypoxia. Her EKG is essentially unremarkable. She will be prescribed a small quantity of Ativan which she can take as needed. I have considered a cardiac etiology, however highly doubt this. She is PERC negative.    Geoffery Lyons, MD 04/22/15 [phone]

## 2015-04-22 NOTE — Discharge Instructions (Signed)
Ativan as prescribed as needed for anxiety.  Follow-up with your primary Dr. if symptoms are not improving in the next week.   Generalized Anxiety Disorder Generalized anxiety disorder (GAD) is a mental disorder. It interferes with life functions, including relationships, work, and school. GAD is different from normal anxiety, which everyone experiences at some point in their lives in response to specific life events and activities. Normal anxiety actually helps [place] prepare for and get through these life events and activities. Normal anxiety goes away after the event or activity is over.  GAD causes anxiety that is not necessarily related to specific events or activities. It also causes excess anxiety in proportion to specific events or activities. The anxiety associated with GAD is also difficult to control. GAD can vary from mild to severe. People with severe GAD can have intense waves of anxiety with physical symptoms (panic attacks).  SYMPTOMS The anxiety and worry associated with GAD are difficult to control. This anxiety and worry are related to many life events and activities and also occur more days than not for 6 months or longer. People with GAD also have three or more of the following symptoms (one or more in children):  Restlessness.   Fatigue.  Difficulty concentrating.   Irritability.  Muscle tension.  Difficulty sleeping or unsatisfying sleep. DIAGNOSIS GAD is diagnosed through an assessment by your health care provider. Your health care provider will ask you questions aboutyour mood,physical symptoms, and events in your life. Your health care provider may ask you about your medical history and use of alcohol or drugs, including prescription medicines. Your health care provider may also do a physical exam and blood tests. Certain medical conditions and the use of certain substances can cause symptoms similar to those associated with GAD. Your health care provider may refer  you to a mental health specialist for further evaluation. TREATMENT The following therapies are usually used to treat GAD:   Medication. Antidepressant medication usually is prescribed for long-term daily control. Antianxiety medicines may be added in severe cases, especially when panic attacks occur.   Talk therapy (psychotherapy). Certain types of talk therapy can be helpful in treating GAD by providing support, education, and guidance. A form of talk therapy called cognitive behavioral therapy can teach you healthy ways to think about and react to daily life events and activities.  Stress managementtechniques. These include yoga, meditation, and exercise and can be very helpful when they are practiced regularly. A mental health specialist can help determine which treatment is best for you. Some people see improvement with one therapy. However, other people require a combination of therapies.   This information is not intended to replace advice given to you by your health care provider. Make sure you discuss any questions you have with your health care provider.   Document Released: 07/10/2012 Document Revised: 04/05/2014 Document Reviewed: 07/10/2012 Elsevier Interactive Patient Education [employer]

## 2015-04-22 NOTE — ED Notes (Signed)
Pt has multiple anxiety related complaints, generalized chest pain, SOB, nausea and shaking that started at 2100 after "not a good night."

## 2015-06-01 ENCOUNTER — Encounter (HOSPITAL_BASED_OUTPATIENT_CLINIC_OR_DEPARTMENT_OTHER): Payer: Self-pay | Admitting: *Deleted

## 2015-06-01 ENCOUNTER — Emergency Department (HOSPITAL_BASED_OUTPATIENT_CLINIC_OR_DEPARTMENT_OTHER)
Admission: EM | Admit: 2015-06-01 | Discharge: 2015-06-01 | Disposition: A | Discharge status: Home / Self Care | Payer: Medicaid Other | Attending: Emergency Medicine | Admitting: Emergency Medicine

## 2015-06-01 DIAGNOSIS — Z8719 Personal history of other diseases of the digestive system: Secondary | ICD-10-CM | POA: Insufficient documentation

## 2015-06-01 DIAGNOSIS — L923 Foreign body granuloma of the skin and subcutaneous tissue: Secondary | ICD-10-CM

## 2015-06-01 DIAGNOSIS — F17210 Nicotine dependence, cigarettes, uncomplicated: Secondary | ICD-10-CM | POA: Insufficient documentation

## 2015-06-01 DIAGNOSIS — L988 Other specified disorders of the skin and subcutaneous tissue: Secondary | ICD-10-CM | POA: Insufficient documentation

## 2015-06-01 DIAGNOSIS — F419 Anxiety disorder, unspecified: Secondary | ICD-10-CM | POA: Insufficient documentation

## 2015-06-01 DIAGNOSIS — Z79899 Other long term (current) drug therapy: Secondary | ICD-10-CM | POA: Insufficient documentation

## 2015-06-01 DIAGNOSIS — L539 Erythematous condition, unspecified: Secondary | ICD-10-CM | POA: Insufficient documentation

## 2015-06-01 MED ORDER — MUPIROCIN CALCIUM 2 % EX CREA
TOPICAL_CREAM | Freq: Three times a day (TID) | CUTANEOUS | Status: DC
  Filled 2015-06-01: qty 15

## 2015-06-01 MED ORDER — MUPIROCIN 2 % EX OINT
TOPICAL_OINTMENT | CUTANEOUS | Status: AC
  Administered 2015-06-01: 06:00:00
  Filled 2015-06-01: qty 22

## 2015-06-01 NOTE — ED Provider Notes (Signed)
CSN: [id number]     Arrival date & time 06/01/15  0447 History   First MD Initiated Contact with Patient 06/01/15 [phone]     Chief Complaint  Patient presents with  . Joint Swelling     (Consider location/radiation/quality/duration/timing/severity/associated sxs/prior Treatment) HPI  This is a 25 year old female who had a tattoo placed on her left lateral lower leg 4 days ago. She has been ambulatory without difficulty. She is concerned because she has developed some mild to moderate edema of the left ankle. She is having some occasional shooting pain in the region of the tattoo when ambulating but otherwise is not in significant pain. There is erythema of the skin surrounding the tattoo. She denies systemic symptoms such as fever, chills, nausea, vomiting or diarrhea.  Past Medical History  Diagnosis Date  . Colitis   . Anxiety    History reviewed. No pertinent past surgical history. History reviewed. No pertinent family history. Social History  Substance Use Topics  . Smoking status: Current Every Day Smoker -- 0.50 packs/day    Types: Cigarettes  . Smokeless tobacco: Never Used  . Alcohol Use: No   OB History    No data available     Review of Systems  All other systems reviewed and are negative.   Allergies  Review of patient's allergies indicates no known allergies.  Home Medications   Prior to Admission medications   Medication Sig Start Date End Date Taking? Authorizing Provider  benzonatate (TESSALON) 100 MG capsule Take 1 capsule (100 mg total) by mouth every 8 (eight) hours. 04/06/15   Melene Planan Floyd, DO  HYDROcodone-acetaminophen (NORCO/VICODIN) 5-325 MG per tablet Take 2 tablets by mouth every 4 (four) hours as needed. 09/26/13   Elson AreasLeslie K Sofia, PA-C  ibuprofen (ADVIL,MOTRIN) 200 MG tablet Take 200 mg by mouth every 6 (six) hours as needed.    Historical Provider, MD  LORazepam (ATIVAN) 1 MG tablet Take 1 tablet (1 mg total) by mouth every 6 (six) hours as needed for  anxiety. 04/22/15   Geoffery Lyonsouglas Delo, MD  mesalamine (ASACOL) 400 MG EC tablet Take 400 mg by mouth 3 (three) times daily.    Historical Provider, MD  mesalamine (LIALDA) 1.2 g EC tablet Take by mouth daily with breakfast.    Historical Provider, MD  oseltamivir (TAMIFLU) 75 MG capsule Take 1 capsule (75 mg total) by mouth every 12 (twelve) hours. 04/06/15   Melene Planan Floyd, DO  Phenazopyridine HCl (AZO TABS PO) Take by mouth.    Historical Provider, MD   BP 117/78 mmHg  Pulse 104  Temp(Src) 97.9 F (36.6 C) (Oral)  Resp 16  Ht 5' (1.524 m)  Wt 95 lb (43.092 kg)  BMI 18.55 kg/m2  SpO2 100%  LMP 05/14/2015 (Approximate)   Physical Exam  General: Well-developed, well-nourished female in no acute distress; appearance consistent with age of record HENT: normocephalic; atraumatic Eyes: pupils equal, round and reactive to light; extraocular muscles intact Neck: supple Heart: regular rate and rhythm Lungs: clear to auscultation bilaterally Abdomen: soft; nondistended; nontender Extremities: No deformity; full range of motion; pulses normal; erythema and mild-to-moderate tenderness of tattoo skin of the lateral left lower leg:    Neurologic: Awake, alert and oriented; motor function intact in all extremities and symmetric; no facial droop Skin: Warm and dry Psychiatric: Normal mood and affect    ED Course  Procedures (including critical care time)   MDM  The patient's edema is likely due to inflammation of the skin that was  tattooed recently. She does not have a pattern of pain or tenderness suggestive of a deep vein thrombosis. There is some mild warmth associated with the erythema but it does not appear to be a frank cellulitis. We will treat with topical mupirocin ointment and have her return if symptoms worsen.    Paula Libra, MD 06/01/15 [phone]

## 2015-06-01 NOTE — ED Notes (Signed)
C/o L ankle swelling, denies pain, LLE tattoo on Wednesday. CMS & ROM intact.( Denies: fever, nvd, dizziness, sob or other sx).

## 2015-06-01 NOTE — ED Notes (Signed)
Dr. Molpus at [facility] 

## 2015-07-07 DIAGNOSIS — K5190 Ulcerative colitis, unspecified, without complications: Secondary | ICD-10-CM | POA: Diagnosis present

## 2015-07-07 DIAGNOSIS — K5660 Unspecified intestinal obstruction: Principal | ICD-10-CM | POA: Diagnosis present

## 2015-07-07 DIAGNOSIS — F17210 Nicotine dependence, cigarettes, uncomplicated: Secondary | ICD-10-CM | POA: Diagnosis present

## 2015-07-07 DIAGNOSIS — Z79899 Other long term (current) drug therapy: Secondary | ICD-10-CM

## 2015-07-07 DIAGNOSIS — F419 Anxiety disorder, unspecified: Secondary | ICD-10-CM | POA: Diagnosis present

## 2015-07-07 DIAGNOSIS — E876 Hypokalemia: Secondary | ICD-10-CM | POA: Diagnosis present

## 2015-07-07 DIAGNOSIS — Z8249 Family history of ischemic heart disease and other diseases of the circulatory system: Secondary | ICD-10-CM

## 2015-07-07 DIAGNOSIS — D649 Anemia, unspecified: Secondary | ICD-10-CM | POA: Diagnosis present

## 2015-07-08 ENCOUNTER — Emergency Department (HOSPITAL_BASED_OUTPATIENT_CLINIC_OR_DEPARTMENT_OTHER): Payer: Self-pay

## 2015-07-08 ENCOUNTER — Inpatient Hospital Stay (HOSPITAL_BASED_OUTPATIENT_CLINIC_OR_DEPARTMENT_OTHER)
Admission: EM | Admit: 2015-07-08 | Discharge: 2015-07-12 | DRG: 389 | Discharge status: Against Medical Advice | Payer: Self-pay | Attending: Internal Medicine | Admitting: Internal Medicine

## 2015-07-08 ENCOUNTER — Encounter (HOSPITAL_BASED_OUTPATIENT_CLINIC_OR_DEPARTMENT_OTHER): Payer: Self-pay | Admitting: *Deleted

## 2015-07-08 DIAGNOSIS — E876 Hypokalemia: Secondary | ICD-10-CM

## 2015-07-08 DIAGNOSIS — K56609 Unspecified intestinal obstruction, unspecified as to partial versus complete obstruction: Secondary | ICD-10-CM | POA: Diagnosis present

## 2015-07-08 DIAGNOSIS — K51918 Ulcerative colitis, unspecified with other complication: Secondary | ICD-10-CM

## 2015-07-08 DIAGNOSIS — K5190 Ulcerative colitis, unspecified, without complications: Secondary | ICD-10-CM

## 2015-07-08 LAB — CBC WITH DIFFERENTIAL/PLATELET
Basophils Absolute: 0 10*3/uL (ref 0.0–0.1)
Basophils Relative: 0 %
Eosinophils Absolute: 0.1 10*3/uL (ref 0.0–0.7)
Eosinophils Relative: 1 %
HCT: 42.2 % (ref 36.0–46.0)
Hemoglobin: 14.4 g/dL (ref 12.0–15.0)
LYMPHS PCT: 10 %
Lymphs Abs: 1.3 10*3/uL (ref 0.7–4.0)
MCH: 29.8 pg (ref 26.0–34.0)
MCHC: 34.1 g/dL (ref 30.0–36.0)
MCV: 87.2 fL (ref 78.0–100.0)
Monocytes Absolute: 0.5 10*3/uL (ref 0.1–1.0)
Monocytes Relative: 4 %
Neutro Abs: 10.8 10*3/uL — ABNORMAL HIGH (ref 1.7–7.7)
Neutrophils Relative %: 85 %
Platelets: 170 10*3/uL (ref 150–400)
RBC: 4.84 MIL/uL (ref 3.87–5.11)
RDW: 12.7 % (ref 11.5–15.5)
WBC: 12.7 10*3/uL — AB (ref 4.0–10.5)

## 2015-07-08 LAB — CBC
HCT: 35.6 % — ABNORMAL LOW (ref 36.0–46.0)
Hemoglobin: 12 g/dL (ref 12.0–15.0)
MCH: 30.2 pg (ref 26.0–34.0)
MCHC: 33.7 g/dL (ref 30.0–36.0)
MCV: 89.4 fL (ref 78.0–100.0)
PLATELETS: 169 10*3/uL (ref 150–400)
RBC: 3.98 MIL/uL (ref 3.87–5.11)
RDW: 13 % (ref 11.5–15.5)
WBC: 11.4 10*3/uL — AB (ref 4.0–10.5)

## 2015-07-08 LAB — URINALYSIS, ROUTINE W REFLEX MICROSCOPIC
BILIRUBIN URINE: NEGATIVE
Glucose, UA: NEGATIVE mg/dL
Hgb urine dipstick: NEGATIVE
Ketones, ur: >80 mg/dL — AB
NITRITE: NEGATIVE
PROTEIN: 30 mg/dL — AB
SPECIFIC GRAVITY, URINE: 1.036 — AB (ref 1.005–1.030)
pH: 6 (ref 5.0–8.0)

## 2015-07-08 LAB — PREGNANCY, URINE: PREG TEST UR: NEGATIVE

## 2015-07-08 LAB — COMPREHENSIVE METABOLIC PANEL
ALBUMIN: 5.1 g/dL — AB (ref 3.5–5.0)
ALT: 18 U/L (ref 14–54)
AST: 28 U/L (ref 15–41)
Alkaline Phosphatase: 44 U/L (ref 38–126)
Anion gap: 11 (ref 5–15)
BUN: 15 mg/dL (ref 6–20)
CHLORIDE: 105 mmol/L (ref 101–111)
CO2: 21 mmol/L — ABNORMAL LOW (ref 22–32)
CREATININE: 0.61 mg/dL (ref 0.44–1.00)
Calcium: 10 mg/dL (ref 8.9–10.3)
GFR calc Af Amer: >60 mL/min (ref >60)
Glucose, Bld: 119 mg/dL — ABNORMAL HIGH (ref 65–99)
Potassium: 3.3 mmol/L — ABNORMAL LOW (ref 3.5–5.1)
SODIUM: 137 mmol/L (ref 135–145)
Total Bilirubin: 1 mg/dL (ref 0.3–1.2)
Total Protein: 8.4 g/dL — ABNORMAL HIGH (ref 6.5–8.1)

## 2015-07-08 LAB — URINE MICROSCOPIC-ADD ON: RBC / HPF: NONE SEEN RBC/hpf (ref 0–5)

## 2015-07-08 LAB — CREATININE, SERUM
Creatinine, Ser: 0.49 mg/dL (ref 0.44–1.00)
GFR calc non Af Amer: >60 mL/min (ref >60)

## 2015-07-08 LAB — LIPASE, BLOOD: Lipase: 19 U/L (ref 11–51)

## 2015-07-08 LAB — I-STAT CG4 LACTIC ACID, ED
Lactic Acid, Venous: 0.8 mmol/L (ref 0.5–2.0)
Lactic Acid, Venous: 2.3 mmol/L (ref 0.5–2.0)

## 2015-07-08 MED ORDER — ONDANSETRON HCL 4 MG/2ML IJ SOLN
4.0000 mg | Freq: Four times a day (QID) | INTRAMUSCULAR | Status: DC | PRN
  Administered 2015-07-08 – 2015-07-11 (×7): 4 mg via INTRAVENOUS
  Filled 2015-07-08 (×7): qty 2

## 2015-07-08 MED ORDER — SODIUM CHLORIDE 0.9 % IV SOLN
INTRAVENOUS | Status: DC
  Administered 2015-07-08: 04:00:00 via INTRAVENOUS

## 2015-07-08 MED ORDER — SODIUM CHLORIDE 0.9 % IV SOLN: INTRAVENOUS | Status: DC

## 2015-07-08 MED ORDER — CETYLPYRIDINIUM CHLORIDE 0.05 % MT LIQD
7.0000 mL | Freq: Two times a day (BID) | OROMUCOSAL | Status: DC
  Administered 2015-07-08 – 2015-07-11 (×7): 7 mL via OROMUCOSAL

## 2015-07-08 MED ORDER — LIDOCAINE VISCOUS 2 % MT SOLN
15.0000 mL | Freq: Once | OROMUCOSAL | Status: AC
  Administered 2015-07-08: 15 mL via OROMUCOSAL
  Filled 2015-07-08: qty 15

## 2015-07-08 MED ORDER — CHLORPROMAZINE HCL 25 MG/ML IJ SOLN: 25.0000 mg | Freq: Three times a day (TID) | INTRAMUSCULAR | Status: DC

## 2015-07-08 MED ORDER — HYDROMORPHONE HCL 1 MG/ML IJ SOLN
1.0000 mg | INTRAMUSCULAR | Status: DC | PRN
  Administered 2015-07-08 – 2015-07-11 (×25): 1 mg via INTRAVENOUS
  Filled 2015-07-08 (×25): qty 1

## 2015-07-08 MED ORDER — SODIUM CHLORIDE 0.9 % IV SOLN
INTRAVENOUS | Status: AC
  Administered 2015-07-08: 11:00:00 via INTRAVENOUS

## 2015-07-08 MED ORDER — SODIUM CHLORIDE 0.9 % IV SOLN
25.0000 mg | Freq: Three times a day (TID) | INTRAVENOUS | Status: DC
  Administered 2015-07-08 – 2015-07-09 (×3): 25 mg via INTRAVENOUS
  Filled 2015-07-08 (×4): qty 1

## 2015-07-08 MED ORDER — METRONIDAZOLE IN NACL 5-0.79 MG/ML-% IV SOLN
500.0000 mg | Freq: Three times a day (TID) | INTRAVENOUS | Status: DC
  Administered 2015-07-08 – 2015-07-11 (×10): 500 mg via INTRAVENOUS
  Filled 2015-07-08 (×12): qty 100

## 2015-07-08 MED ORDER — ONDANSETRON HCL 4 MG PO TABS
4.0000 mg | ORAL_TABLET | Freq: Four times a day (QID) | ORAL | Status: DC | PRN
  Administered 2015-07-11: 4 mg via ORAL
  Filled 2015-07-08: qty 1

## 2015-07-08 MED ORDER — KCL IN DEXTROSE-NACL 40-5-0.45 MEQ/L-%-% IV SOLN
INTRAVENOUS | Status: DC
  Administered 2015-07-08: 09:00:00 via INTRAVENOUS
  Administered 2015-07-09: 1000 mL via INTRAVENOUS
  Filled 2015-07-08 (×4): qty 1000

## 2015-07-08 MED ORDER — HEPARIN SODIUM (PORCINE) 5000 UNIT/ML IJ SOLN
5000.0000 [IU] | Freq: Three times a day (TID) | INTRAMUSCULAR | Status: DC
  Filled 2015-07-08 (×6): qty 1

## 2015-07-08 MED ORDER — SODIUM CHLORIDE 0.9 % IV BOLUS (SEPSIS)
1000.0000 mL | Freq: Once | INTRAVENOUS | Status: AC
  Administered 2015-07-08: 1000 mL via INTRAVENOUS

## 2015-07-08 MED ORDER — ONDANSETRON HCL 4 MG/2ML IJ SOLN: 4.0000 mg | Freq: Four times a day (QID) | INTRAMUSCULAR | Status: DC | PRN

## 2015-07-08 MED ORDER — CIPROFLOXACIN IN D5W 400 MG/200ML IV SOLN
400.0000 mg | Freq: Two times a day (BID) | INTRAVENOUS | Status: DC
  Administered 2015-07-08 – 2015-07-11 (×7): 400 mg via INTRAVENOUS
  Filled 2015-07-08 (×8): qty 200

## 2015-07-08 MED ORDER — HYDROMORPHONE HCL 1 MG/ML IJ SOLN
1.0000 mg | Freq: Once | INTRAMUSCULAR | Status: AC
  Administered 2015-07-08: 1 mg via INTRAVENOUS
  Filled 2015-07-08: qty 1

## 2015-07-08 MED ORDER — MAGNESIUM SULFATE 2 GM/50ML IV SOLN
2.0000 g | Freq: Once | INTRAVENOUS | Status: AC
  Administered 2015-07-08: 2 g via INTRAVENOUS
  Filled 2015-07-08: qty 50

## 2015-07-08 MED ORDER — LORAZEPAM 2 MG/ML IJ SOLN
1.0000 mg | Freq: Once | INTRAMUSCULAR | Status: AC
  Administered 2015-07-08: 1 mg via INTRAVENOUS
  Filled 2015-07-08: qty 1

## 2015-07-08 MED ORDER — BENZOCAINE 20 % MT SOLN
Freq: Once | OROMUCOSAL | Status: AC
  Administered 2015-07-08: 04:00:00 via OROMUCOSAL
  Filled 2015-07-08: qty 57

## 2015-07-08 MED ORDER — IOPAMIDOL (ISOVUE-300) INJECTION 61%
90.0000 mL | Freq: Once | INTRAVENOUS | Status: AC | PRN
  Administered 2015-07-08: 90 mL via INTRAVENOUS

## 2015-07-08 MED ORDER — METHYLPREDNISOLONE SODIUM SUCC 125 MG IJ SOLR
60.0000 mg | Freq: Four times a day (QID) | INTRAMUSCULAR | Status: DC
  Administered 2015-07-08 – 2015-07-09 (×5): 60 mg via INTRAVENOUS
  Filled 2015-07-08 (×8): qty 0.96

## 2015-07-08 MED ORDER — ONDANSETRON HCL 4 MG/2ML IJ SOLN
4.0000 mg | Freq: Once | INTRAMUSCULAR | Status: AC
  Administered 2015-07-08: 4 mg via INTRAVENOUS
  Filled 2015-07-08: qty 2

## 2015-07-08 NOTE — ED Notes (Signed)
[facility] answering service was notified that pt was no longer coming to [facility].

## 2015-07-08 NOTE — ED Notes (Signed)
Patient still drinking PO contrast at this time. Patient reports increase in hiccups and has drank approx. 

## 2015-07-08 NOTE — Progress Notes (Signed)
25 year old female with ulcerative colitis, typically managed well with mesalamine only who presents with acute N/V and abdominal pain.    BP 108/73 mmHg  Pulse 65  Temp(Src) 97.8 F (36.6 C) (Oral)  Resp 18  Ht 5' (1.524 m)  Wt 43.092 kg (95 lb)  BMI 18.55 kg/m2  SpO2 100%  LMP 06/11/2015  K mildly low.  Cr normal.  WBC 12.7K.  Lactate 2.3, normalized with fluids.   CT abdomen and pelvis with contrast appears to show SBO.  Case was discussed with Dr. Rhona Leavenshiu, [facility] who recommended admission, likely SBO, hold steroids (doubt UC flare), and they would consult.  Unfortunately, no beds [facility], so patient to be transferred to med surg bed at [facility].  ED will attempt to place NG tube and send with IVF and supportive care for SBO.  Dr. Elesa MassedWard did not speak with our GI our Gen Surg, but will plan to consult them in the morning.

## 2015-07-08 NOTE — ED Provider Notes (Signed)
By signing my name below, I, Amanda Dominguez, attest that this documentation has been prepared under the direction and in the presence of [employer], DO. Electronically Signed: Linus Dominguez, ED Scribe. 07/08/2015. 12:31 AM.  TIME SEEN: 12:19 AM  CHIEF COMPLAINT:  Chief Complaint  Patient presents with  . Abdominal Pain   HPI:  HPI Comments: Amanda Dominguez is a 25 y.o. female with history of ulcerative colitis on mesalamine brought by mother to the Emergency Department with a PMHx of ulcerative colitis complaining of "severe" abdominal pain that began today at noon. Pt also reports "severe" vomiting and diarrhea. Mother states they were at [facility] ED to be evaluated but the wait was too long so they came here. Pt denies any blood in stool, melena, dysuria, vaginal bleeding, fever or any other symptom at this time. Pt is compliant with her all of her medications. LMP was last month.  No history of sick contacts or recent travel. She has never had abdominal surgery.  Dr. Vashti Hey, [facility]  Guerry Bruin, PCP at [facility]   ROS: See HPI Constitutional: no fever  Eyes: no drainage  ENT: no runny nose   Cardiovascular:  no chest pain  Resp: no SOB  GI: abdominal pain, vomiting, diarrhea GU: no dysuria Integumentary: no rash  Allergy: no hives  Musculoskeletal: no leg swelling  Neurological: no slurred speech ROS otherwise negative  PAST MEDICAL HISTORY/PAST SURGICAL HISTORY:  Past Medical History  Diagnosis Date  . Colitis   . Anxiety     MEDICATIONS:  Prior to Admission medications   Medication Sig Start Date End Date Taking? Authorizing Provider  benzonatate (TESSALON) 100 MG capsule Take 1 capsule (100 mg total) by mouth every 8 (eight) hours. 04/06/15   Melene Plan, DO  HYDROcodone-acetaminophen (NORCO/VICODIN) 5-325 MG per tablet Take 2 tablets by mouth every 4 (four) hours as needed. 09/26/13   Elson Areas, PA-C  ibuprofen (ADVIL,MOTRIN) 200 MG  tablet Take 200 mg by mouth every 6 (six) hours as needed.    Historical Provider, MD  LORazepam (ATIVAN) 1 MG tablet Take 1 tablet (1 mg total) by mouth every 6 (six) hours as needed for anxiety. 04/22/15   Geoffery Lyons, MD  mesalamine (ASACOL) 400 MG EC tablet Take 400 mg by mouth 3 (three) times daily.    Historical Provider, MD  mesalamine (LIALDA) 1.2 g EC tablet Take by mouth daily with breakfast.    Historical Provider, MD  oseltamivir (TAMIFLU) 75 MG capsule Take 1 capsule (75 mg total) by mouth every 12 (twelve) hours. 04/06/15   Melene Plan, DO  Phenazopyridine HCl (AZO TABS PO) Take by mouth.    Historical Provider, MD    ALLERGIES:  No Known Allergies  SOCIAL HISTORY:  Social History  Substance Use Topics  . Smoking status: Current Every Day Smoker -- 0.50 packs/day    Types: Cigarettes  . Smokeless tobacco: Never Used  . Alcohol Use: No    FAMILY HISTORY: No family history on file.  EXAM: BP 100/60 mmHg  Pulse 70  Temp(Src) 97.8 F (36.6 C) (Oral)  Resp 18  Ht 5' (1.524 m)  Wt 95 lb (43.092 kg)  BMI 18.55 kg/m2  SpO2 100%  LMP 06/11/2015 CONSTITUTIONAL: Alert and oriented and responds appropriately to questions. Thin, appears uncomfortable, tearful, actively dry heaving; GCS 15, Afebrile HEAD: Normocephalic; atraumatic EYES: Conjunctivae clear, PERRL, EOMI ENT: normal nose; no rhinorrhea; moist mucous membranes; pharynx without lesions noted; no dental  injury; no hemotypanum; no septal hematoma NECK: Supple, no meningismus, no LAD; no midline spinal tenderness, step-off or deformity CARD: RRR; S1 and S2 appreciated; no murmurs, no clicks, no rubs, no gallops RESP: Normal chest excursion without splinting or tachypnea; breath sounds clear and equal bilaterally; no wheezes, no rhonchi, no rales; chest wall stable, nontender to palpation ABD/GI: Normal bowel sounds; non-distended; soft, no rebound, diffusely tender with diffuse voluntary guarding.  PELVIS:  stable,  nontender to palpation BACK:  The back appears normal and is non-tender to palpation, there is no CVA tenderness; no midline spinal tenderness, step-off or deformity EXT: Normal ROM in all joints; non-tender to palpation; no edema; normal capillary refill; no cyanosis    SKIN: Normal color for age and race; warm NEURO: Moves all extremities equally PSYCH: The patient's mood and manner are appropriate. Grooming and personal hygiene are appropriate.  MEDICAL DECISION MAKING: Patient here with likely ulcerative colitis flare. She appears very uncomfortable on exam is diffusely tender with voluntary guarding. Last CT scan was in 2015. Given how tender she is on exam I do feel she needs another CT scan to rule any complications from her colitis. We'll obtain labs, urine. Will give IV fluids, Zofran, Dilaudid and reassess. She may need admission. They would like transferred [facility] if she requires admission.  ED PROGRESS: 2:50 AM  Pt's mother reports patient is feeling better. She is currently having a CT scan. She does have mild leukocytosis with left shift. Lactate is mildly elevated. She does have large ketones in her urine but no obvious sign of infection. She has trace leukocytes but rare bacteria and many squamous cells. Suspect this is a dirty catch. She is not pregnant. We'll continue to aggressively hydrate patient.  3:30 AM  Pt's CT scan shows small bowel obstruction at the level of the ileocecal valve. No perforation or abscess. There are abnormalities of the colon consistent with long-standing colitis. I recommended nasogastric tube for patient at this time she refuses. I think that she is scared about this procedure. We have spent time explaining the procedure to patient and how it could make her feel better. She has never had a bowel obstruction before. We will consult gastroenterology on call at [facility] and anticipate admission to medicine. She does not  have a surgical abdomen. She appears more comfortable currently after pain medication. We will keep her NPO and continue IV hydration.   3:45 AM  D/w Dr Rocky Crafts on call for gastroenterology at [facility]. She agrees with plan for bowel rest, IV hydration and nasogastric tube. She would recommend holding off on IV steroids in a patient with bowel obstruction. There is no sign of active colitis exacerbation currently. She thinks the bowel obstruction is likely from a stricture. Agrees with admission to medicine. She will update her oncoming colleague about patient's case and that they can see her in consult. We appreciate gastroenterology help.  4:10 AM  D/w Dr. Anselmo Rod with hospitalist service at [facility].  He states that there are no beds available at all at [facility] this time. We will need to discuss patient's care with hospitalist at [facility]. Mother and patient are okay with this.  4:35 AM  D/w Dr. Maryfrances Bunnell with hospitalist service at [facility]. There are unfortunately no medical beds at [facility]. There are medical beds available at [facility]. Will transfer patient to [facility] to a medical bed. She  will be admitted to inpatient service. We will continue IV hydration, pain medication, nausea medicine. She is NPO and has an NG tube in place. Patient and mother updated with this plan. They state that they would like transferred [facility] once a bed is available there. [facility] will be Dr. Antionette Charpyd.  Dr. Maryfrances Bunnellanford states he will update Dr. Antionette Charpyd and they can [facility] in the AM.  I reviewed all nursing notes, vitals, pertinent old records, EKGs, labs, imaging (as available).    I personally performed the services described in this documentation, which was scribed in my presence. The recorded information has been reviewed and is accurate.    Layla MawKristen N Jodean Valade, DO 07/08/15 [phone]

## 2015-07-08 NOTE — ED Notes (Signed)
Patient transported to CT 

## 2015-07-08 NOTE — ED Notes (Signed)
Pt. Has history of ulcertive  Colitis.  Pt. Feels like she is having a flare of her colitis.  Pt. Reports a lot of diarrhea and a lot of vomiting today.

## 2015-07-08 NOTE — ED Notes (Signed)
Patient ambulated to the restroom without assistance 

## 2015-07-08 NOTE — H&P (Signed)
[facility] History and Physical  Xoe Hoe KGM:[id number] DOB: 1990-07-27 DOA: 07/08/2015  Referring physician:  PCP: No PCP Per Patient   Chief Complaint: Nausea and vomting  HPI: Amanda Dominguez is a 25 y.o. female past medical history ulcerative colitis on mesalamine comes into the ED complaining of severe abdominal pain and nausea and vomiting that started on the day of admission. She denies any diarrhea or any bloody diarrhea. Her emesis has had no blood. Denies any new medication she relates she has been taking her mesalamine, denies any vaginal bleeding dysuria burning when he urinates. She denies any rashes. Her sick contacts. She is never had abdominal surgery.  In the ED: Basic metabolic panel showed mild hypokalemia she had a mild elevation in her lactic acid, with mild leukocytosis and left shift, UA did not show signs of infection, but she did had a CT scan of the abdomen and pelvis as below.  Review of Systems:  Constitutional:  No weight loss, night sweats, Fevers, chills, fatigue.  HEENT:  No headaches, Difficulty swallowing,Tooth/dental problems,Sore throat,  No sneezing, itching, ear ache, nasal congestion, post nasal drip,  Cardio-vascular:  No chest pain, Orthopnea, PND, swelling in lower extremities, anasarca, dizziness, palpitations  GI:  No heartburn, indigestion, abdominal pain,  loss of appetite  Resp:  No shortness of breath with exertion or at rest. No excess mucus, no productive cough, No non-productive cough, No coughing up of blood.No change in color of mucus.No wheezing.No chest wall deformity  Skin:  no rash or lesions.  GU:  no dysuria, change in color of urine, no urgency or frequency. No flank pain.  Musculoskeletal:  No joint pain or swelling. No decreased range of motion. No back pain.  Psych:  No change in mood or affect. No depression or anxiety. No memory loss.   Past Medical History  Diagnosis Date  . Colitis   . Anxiety     History reviewed. No pertinent past surgical history. Social History:  reports that she has been smoking Cigarettes.  She has been smoking about 0.50 packs per day. She has never used smokeless tobacco. She reports that she does not drink alcohol or use illicit drugs.  No Known Allergies  Family History  Problem Relation Age of Onset  . Heart attack Father      Prior to Admission medications   Medication Sig Start Date End Date Taking? Authorizing Provider  mesalamine (LIALDA) 1.2 g EC tablet Take 1.2 g by mouth 3 (three) times daily.    Yes Historical Provider, MD  benzonatate (TESSALON) 100 MG capsule Take 1 capsule (100 mg total) by mouth every 8 (eight) hours. Patient not taking: Reported on 07/08/2015 04/06/15   Melene Plan, DO  HYDROcodone-acetaminophen (NORCO/VICODIN) 5-325 MG per tablet Take 2 tablets by mouth every 4 (four) hours as needed. Patient not taking: Reported on 07/08/2015 09/26/13   Elson Areas, PA-C  LORazepam (ATIVAN) 1 MG tablet Take 1 tablet (1 mg total) by mouth every 6 (six) hours as needed for anxiety. Patient not taking: Reported on 07/08/2015 04/22/15   Geoffery Lyons, MD  oseltamivir (TAMIFLU) 75 MG capsule Take 1 capsule (75 mg total) by mouth every 12 (twelve) hours. Patient not taking: Reported on 07/08/2015 04/06/15   Melene Plan, DO   Physical Exam: Filed Vitals:   07/08/15 0321 07/08/15 0500 07/08/15 0601 07/08/15 0650  BP: 108/73  112/67 110/54  Pulse: 65 67 76 93  Temp:  97.9 F (36.6 C) 98.5 F (36.9  C) 98.1 F (36.7 C)  TempSrc: Oral  Oral Oral  Resp: Height:      Weight:      SpO2: 100% 100% 99% 100%    Wt Readings from Last 3 Encounters:  07/08/15 43.092 kg (95 lb)  06/01/15 43.092 kg (95 lb)  04/22/15 40.824 kg (90 lb)    General:  Appears calm and comfortable Eyes: PERRL, normal lids, irises & conjunctiva ENT: grossly normal hearing, lips & tongue Neck: no LAD, masses or thyromegaly Cardiovascular: RRR, no m/r/g. No LE  edema. Telemetry: SR, no arrhythmias  Respiratory: CTA bilaterally, no w/r/r. Normal respiratory effort. Abdomen: soft, With diffuse tenderness no rebound or guarding. Skin: no rash or induration seen on limited exam Musculoskeletal: grossly normal tone BUE/BLE Psychiatric: grossly normal mood and affect, speech fluent and appropriate Neurologic: grossly non-focal.          Labs on Admission:  Basic Metabolic Panel:  Recent Labs Lab 07/08/15 0045  NA 137  K 3.3*  CL 105  CO2 21*  GLUCOSE 119*  BUN 15  CREATININE 0.61  CALCIUM 10.0   Liver Function Tests:  Recent Labs Lab 07/08/15 0045  AST 28  ALT 18  ALKPHOS 44  BILITOT 1.0  PROT 8.4*  ALBUMIN 5.1*    Recent Labs Lab 07/08/15 0045  LIPASE 19   No results for input(s): AMMONIA in the last 168 hours. CBC:  Recent Labs Lab 07/08/15 0045  WBC 12.7*  NEUTROABS 10.8*  HGB 14.4  HCT 42.2  MCV 87.2  PLT 170   Cardiac Enzymes: No results for input(s): CKTOTAL, CKMB, CKMBINDEX, TROPONINI in the last 168 hours.  BNP (last 3 results) No results for input(s): BNP in the last 8760 hours.  ProBNP (last 3 results) No results for input(s): PROBNP in the last 8760 hours.  CBG: No results for input(s): GLUCAP in the last 168 hours.  Radiological Exams on Admission: Ct Abdomen Pelvis W Contrast  07/08/2015  CLINICAL DATA:  Nausea vomiting and diarrhea. Abdominal pain. Duration 12 hours. EXAM: CT ABDOMEN AND PELVIS WITH CONTRAST TECHNIQUE: Multidetector CT imaging of the abdomen and pelvis was performed using the standard protocol following bolus administration of intravenous contrast. CONTRAST:  90mL ISOVUE-300 IOPAMIDOL (ISOVUE-300) INJECTION 61% COMPARISON:  02/16/2014 FINDINGS: Lower chest:  No significant abnormality Hepatobiliary: There are normal appearances of the liver, gallbladder and bile ducts. Pancreas: Normal Spleen: Norm Adrenals/Urinary Tract: The adrenals and kidneys are normal in appearance. There  is no urinary calculus evident. There is no hydronephrosis or ureteral dilatation. Collecting systems and ureters appear unremarkable. Stomach/Bowel: The stomach is normal. There is abnormal dilatation of distal small bowel, continuing all the way to the ileocecal valve. There is fecalization of content within the terminal ileum. The colon is decompressed, with loss of haustral detail and mural stratification typical of long-standing colitis. There is no extraluminal air. There is no abscess or drainable fluid collection. Vascular/Lymphatic: The abdominal aorta is normal in caliber. There is no atherosclerotic calcification. There is no adenopathy in the abdomen or pelvis. Reproductive: Unremarkable uterus and ovaries Other: No ascites. Musculoskeletal: No significant abnormality IMPRESSION: The findings are most consistent with a small bowel obstruction at the level of the ileocecal valve. No perforation. No abscess. There are abnormalities of the colon consistent with long-standing colitis. Electronically Signed   By: Ellery Plunk M.D.   On: 07/08/2015 02:59    EKG: Independently reviewed. none  Assessment/Plan Active Problems:   SBO (  small bowel obstruction) (HCC)   Hypokalemia   Ulcerative colitis (HCC) I agree with placing her nothing by mouth, will start her on IV steroids consult GI for further assistance. There is no abscess or perforation seen on CT there is significant inflammation along the colon that is probably causing inflammatory changes leading to a partial small bowel obstruction. The patient relates she did have several bowel movements prior to admission. She has been taking her mesalamine, she denies any bloody diarrhea or regular bowel movements that appear watery. We'll continue IV fluids replete his electrolytes, for her hiccups will use Thorazine and check a 12-lead EKG in the morning. His narcotic for pain control and Zofran for nausea. Her lipase is 19 which makes  pancreatitis unlikely.  I have consulted GI for further assistance.   Code Status: full DVT Prophylaxis:heparin Family Communication: none Disposition Plan: inpatient  Time spent: 5465 mim  FELIZ Rosine BeatTIZ, Stela Iwasaki [facility] Pager [phone]

## 2015-07-09 ENCOUNTER — Inpatient Hospital Stay (HOSPITAL_COMMUNITY): Payer: Self-pay

## 2015-07-09 ENCOUNTER — Encounter (HOSPITAL_COMMUNITY): Payer: Self-pay | Admitting: Gastroenterology

## 2015-07-09 DIAGNOSIS — K51918 Ulcerative colitis, unspecified with other complication: Secondary | ICD-10-CM

## 2015-07-09 DIAGNOSIS — E876 Hypokalemia: Secondary | ICD-10-CM

## 2015-07-09 LAB — CBC
HEMATOCRIT: 33.7 % — AB (ref 36.0–46.0)
Hemoglobin: 11.4 g/dL — ABNORMAL LOW (ref 12.0–15.0)
MCH: 29.3 pg (ref 26.0–34.0)
MCHC: 33.8 g/dL (ref 30.0–36.0)
MCV: 86.6 fL (ref 78.0–100.0)
PLATELETS: 130 10*3/uL — AB (ref 150–400)
RBC: 3.89 MIL/uL (ref 3.87–5.11)
RDW: 12.7 % (ref 11.5–15.5)
WBC: 7 10*3/uL (ref 4.0–10.5)

## 2015-07-09 LAB — COMPREHENSIVE METABOLIC PANEL
ALBUMIN: 3.7 g/dL (ref 3.5–5.0)
ALT: 16 U/L (ref 14–54)
AST: 17 U/L (ref 15–41)
Alkaline Phosphatase: 29 U/L — ABNORMAL LOW (ref 38–126)
Anion gap: 7 (ref 5–15)
BILIRUBIN TOTAL: 0.4 mg/dL (ref 0.3–1.2)
BUN: 7 mg/dL (ref 6–20)
CHLORIDE: 108 mmol/L (ref 101–111)
CO2: 23 mmol/L (ref 22–32)
CREATININE: 0.49 mg/dL (ref 0.44–1.00)
Calcium: 8.9 mg/dL (ref 8.9–10.3)
GFR calc Af Amer: >60 mL/min (ref >60)
GLUCOSE: 156 mg/dL — AB (ref 65–99)
POTASSIUM: 4.1 mmol/L (ref 3.5–5.1)
Sodium: 138 mmol/L (ref 135–145)
Total Protein: 6.3 g/dL — ABNORMAL LOW (ref 6.5–8.1)

## 2015-07-09 MED ORDER — FLUCONAZOLE IN SODIUM CHLORIDE 100-0.9 MG/50ML-% IV SOLN
100.0000 mg | INTRAVENOUS | Status: DC
  Administered 2015-07-09 – 2015-07-10 (×2): 100 mg via INTRAVENOUS
  Filled 2015-07-09 (×3): qty 50

## 2015-07-09 MED ORDER — SODIUM CHLORIDE 0.9 % IV SOLN
25.0000 mg | Freq: Three times a day (TID) | INTRAVENOUS | Status: DC | PRN
Start: 2015-07-09 — End: 2015-07-12
  Filled 2015-07-09: qty 1

## 2015-07-09 MED ORDER — PHENOL 1.4 % MT LIQD
1.0000 | OROMUCOSAL | Status: DC | PRN
Start: 2015-07-09 — End: 2015-07-12
  Filled 2015-07-09: qty 177

## 2015-07-09 MED ORDER — METHYLPREDNISOLONE SODIUM SUCC 125 MG IJ SOLR
60.0000 mg | Freq: Three times a day (TID) | INTRAMUSCULAR | Status: DC
  Administered 2015-07-09 – 2015-07-10 (×3): 60 mg via INTRAVENOUS
  Filled 2015-07-09 (×5): qty 0.96

## 2015-07-09 MED ORDER — FLUCONAZOLE 100 MG PO TABS
100.0000 mg | ORAL_TABLET | ORAL | Status: DC
  Filled 2015-07-09: qty 1

## 2015-07-09 MED ORDER — PANTOPRAZOLE SODIUM 40 MG IV SOLR
40.0000 mg | Freq: Two times a day (BID) | INTRAVENOUS | Status: DC
  Administered 2015-07-09 – 2015-07-11 (×5): 40 mg via INTRAVENOUS
  Filled 2015-07-09 (×6): qty 40

## 2015-07-09 MED ORDER — DEXTROSE-NACL 5-0.9 % IV SOLN
INTRAVENOUS | Status: DC
  Administered 2015-07-09 (×2): via INTRAVENOUS
  Administered 2015-07-10: 1000 mL via INTRAVENOUS

## 2015-07-09 MED ORDER — LORAZEPAM 2 MG/ML IJ SOLN
0.5000 mg | Freq: Once | INTRAMUSCULAR | Status: AC
  Administered 2015-07-09: 0.5 mg via INTRAVENOUS
  Filled 2015-07-09: qty 1

## 2015-07-09 NOTE — Consult Note (Signed)
Reason for Consult: Small bowel obstruction in patient with history of colitis Referring Physician: Hospital team  Amanda Dominguez is an 25 y.o. female.  HPI: Patient seen and examined and her hospital computer chart was reviewed and yesterday I discussed her case with the hospital team but the plan was to transfer her to [place] and he was going to call me back if my help was needed here but then I was recalled this morning and patient is currently doing better and she has a history of ulcerative colitis for 12 years but she says she's been in remission since November 2015 and that was probably when her last colonoscopy was but she did not know whether her colitis was on the left side or pancolitis and she's only been on steroids and 5-ASA's and no injectables or 6-MP or Imuran and her family history is negative and she had been doing fine until she had pain and nausea vomiting and she did eat some spicy beans and her usual colitis flare includes mostly diarrhea with only a little bit of blood but she has not been having any of that and she has had occasional night sweats but no fever or chills and denies any alcohol or drug use but does smoke and denies any aspirin or nonsteroidals and supposedly has been taking her 5-ASA i.e. Lialda and her family history is negative for any GI problem specifically no colitis or Crohn's and she's never been told she had Crohn's and she denies any skin eye or joint complaints and is currently feeling better and wants to eat  Past Medical History  Diagnosis Date  . Colitis   . Anxiety     History reviewed. No pertinent past surgical history.  Family History  Problem Relation Age of Onset  . Heart attack Father     Social History:  reports that she has been smoking Cigarettes.  She has been smoking about 0.50 packs per day. She has never used smokeless tobacco. She reports that she does not drink alcohol or use illicit drugs.  Allergies: No Known  Allergies  Medications: I have reviewed the patient's current medications.  Results for orders placed or performed during the hospital encounter of 07/08/15 (from the past 48 hour(s))  CBC with Differential     Status: Abnormal   Collection Time: 07/08/15 12:45 AM  Result Value Ref Range   WBC 12.7 (H) 4.0 - 10.5 K/uL   RBC 4.84 3.87 - 5.11 MIL/uL   Hemoglobin 14.4 12.0 - 15.0 g/dL   HCT 42.2 36.0 - 46.0 %   MCV 87.2 78.0 - 100.0 fL   MCH 29.8 26.0 - 34.0 pg   MCHC 34.1 30.0 - 36.0 g/dL   RDW 12.7 11.5 - 15.5 %   Platelets 170 150 - 400 K/uL   Neutrophils Relative % 85 %   Neutro Abs 10.8 (H) 1.7 - 7.7 K/uL   Lymphocytes Relative 10 %   Lymphs Abs 1.3 0.7 - 4.0 K/uL   Monocytes Relative 4 %   Monocytes Absolute 0.5 0.1 - 1.0 K/uL   Eosinophils Relative 1 %   Eosinophils Absolute 0.1 0.0 - 0.7 K/uL   Basophils Relative 0 %   Basophils Absolute 0.0 0.0 - 0.1 K/uL  Comprehensive metabolic panel     Status: Abnormal   Collection Time: 07/08/15 12:45 AM  Result Value Ref Range   Sodium 137 135 - 145 mmol/L   Potassium 3.3 (L) 3.5 - 5.1 mmol/L   Chloride  105 101 - 111 mmol/L   CO2 21 (L) 22 - 32 mmol/L   Glucose, Bld 119 (H) 65 - 99 mg/dL   BUN 15 6 - 20 mg/dL   Creatinine, Ser 0.61 0.44 - 1.00 mg/dL   Calcium 10.0 8.9 - 10.3 mg/dL   Total Protein 8.4 (H) 6.5 - 8.1 g/dL   Albumin 5.1 (H) 3.5 - 5.0 g/dL   AST 28 15 - 41 U/L   ALT 18 14 - 54 U/L   Alkaline Phosphatase 44 38 - 126 U/L   Total Bilirubin 1.0 0.3 - 1.2 mg/dL   GFR calc non Af Amer >60 >60 mL/min   GFR calc Af Amer >60 >60 mL/min    Comment: (NOTE) The eGFR has been calculated using the CKD EPI equation. This calculation has not been validated in all clinical situations. eGFR's persistently <60 mL/min signify possible Chronic Kidney Disease.    Anion gap 11 5 - 15  Lipase, blood     Status: None   Collection Time: 07/08/15 12:45 AM  Result Value Ref Range   Lipase 19 11 - 51 U/L  I-Stat CG4 Lactic Acid, ED      Status: Abnormal   Collection Time: 07/08/15 12:55 AM  Result Value Ref Range   Lactic Acid, Venous 2.30 (HH) 0.5 - 2.0 mmol/L   Comment NOTIFIED PHYSICIAN   Urinalysis, Routine w reflex microscopic (not at [facility])     Status: Abnormal   Collection Time: 07/08/15  1:50 AM  Result Value Ref Range   Color, Urine AMBER (A) YELLOW    Comment: BIOCHEMICALS MAY BE AFFECTED BY COLOR   APPearance CLOUDY (A) CLEAR   Specific Gravity, Urine 1.036 (H) 1.005 - 1.030   pH 6.0 5.0 - 8.0   Glucose, UA NEGATIVE NEGATIVE mg/dL   Hgb urine dipstick NEGATIVE NEGATIVE   Bilirubin Urine NEGATIVE NEGATIVE   Ketones, ur >80 (A) NEGATIVE mg/dL   Protein, ur 30 (A) NEGATIVE mg/dL   Nitrite NEGATIVE NEGATIVE   Leukocytes, UA TRACE (A) NEGATIVE  Pregnancy, urine     Status: None   Collection Time: 07/08/15  1:50 AM  Result Value Ref Range   Preg Test, Ur NEGATIVE NEGATIVE    Comment:        THE SENSITIVITY OF THIS METHODOLOGY IS >20 mIU/mL.   Urine microscopic-add on     Status: Abnormal   Collection Time: 07/08/15  1:50 AM  Result Value Ref Range   Squamous Epithelial / LPF 6-30 (A) NONE SEEN   WBC, UA 0-5 0 - 5 WBC/hpf   RBC / HPF NONE SEEN 0 - 5 RBC/hpf   Bacteria, UA RARE (A) NONE SEEN   Urine-Other MUCOUS PRESENT   I-Stat CG4 Lactic Acid, ED     Status: None   Collection Time: 07/08/15  3:55 AM  Result Value Ref Range   Lactic Acid, Venous 0.80 0.5 - 2.0 mmol/L  CBC     Status: Abnormal   Collection Time: 07/08/15  8:44 AM  Result Value Ref Range   WBC 11.4 (H) 4.0 - 10.5 K/uL   RBC 3.98 3.87 - 5.11 MIL/uL   Hemoglobin 12.0 12.0 - 15.0 g/dL   HCT 35.6 (L) 36.0 - 46.0 %   MCV 89.4 78.0 - 100.0 fL   MCH 30.2 26.0 - 34.0 pg   MCHC 33.7 30.0 - 36.0 g/dL   RDW 13.0 11.5 - 15.5 %   Platelets 169 150 - 400 K/uL  Creatinine, serum  Status: None   Collection Time: 07/08/15  8:44 AM  Result Value Ref Range   Creatinine, Ser 0.49 0.44 - 1.00 mg/dL   GFR calc non Af Amer >60 >60 mL/min    GFR calc Af Amer >60 >60 mL/min    Comment: (NOTE) The eGFR has been calculated using the CKD EPI equation. This calculation has not been validated in all clinical situations. eGFR's persistently <60 mL/min signify possible Chronic Kidney Disease.   Comprehensive metabolic panel     Status: Abnormal   Collection Time: 07/09/15  4:35 AM  Result Value Ref Range   Sodium 138 135 - 145 mmol/L   Potassium 4.1 3.5 - 5.1 mmol/L   Chloride 108 101 - 111 mmol/L   CO2 23 22 - 32 mmol/L   Glucose, Bld 156 (H) 65 - 99 mg/dL   BUN 7 6 - 20 mg/dL   Creatinine, Ser 0.49 0.44 - 1.00 mg/dL   Calcium 8.9 8.9 - 10.3 mg/dL   Total Protein 6.3 (L) 6.5 - 8.1 g/dL   Albumin 3.7 3.5 - 5.0 g/dL   AST 17 15 - 41 U/L   ALT 16 14 - 54 U/L   Alkaline Phosphatase 29 (L) 38 - 126 U/L   Total Bilirubin 0.4 0.3 - 1.2 mg/dL   GFR calc non Af Amer >60 >60 mL/min   GFR calc Af Amer >60 >60 mL/min    Comment: (NOTE) The eGFR has been calculated using the CKD EPI equation. This calculation has not been validated in all clinical situations. eGFR's persistently <60 mL/min signify possible Chronic Kidney Disease.    Anion gap 7 5 - 15  CBC     Status: Abnormal   Collection Time: 07/09/15  4:35 AM  Result Value Ref Range   WBC 7.0 4.0 - 10.5 K/uL   RBC 3.89 3.87 - 5.11 MIL/uL   Hemoglobin 11.4 (L) 12.0 - 15.0 g/dL   HCT 33.7 (L) 36.0 - 46.0 %   MCV 86.6 78.0 - 100.0 fL   MCH 29.3 26.0 - 34.0 pg   MCHC 33.8 30.0 - 36.0 g/dL   RDW 12.7 11.5 - 15.5 %   Platelets 130 (L) 150 - 400 K/uL    Dg Abd 1 View  07/09/2015  CLINICAL DATA:  Follow-up small bowel obstruction EXAM: ABDOMEN - 1 VIEW COMPARISON:  CT scan 07/08/2015 FINDINGS: There is normal small bowel gas pattern. Contrast material from yesterday CT scan noted in right colon. NG tube in place with tip in proximal stomach. IMPRESSION: Normal small bowel gas pattern. NG tube in place. Contrast material from yesterday CT scan noted in right colon. Electronically  Signed   By: Lahoma Crocker M.D.   On: 07/09/2015 11:34   Ct Abdomen Pelvis W Contrast  07/08/2015  CLINICAL DATA:  Nausea vomiting and diarrhea. Abdominal pain. Duration 12 hours. EXAM: CT ABDOMEN AND PELVIS WITH CONTRAST TECHNIQUE: Multidetector CT imaging of the abdomen and pelvis was performed using the standard protocol following bolus administration of intravenous contrast. CONTRAST:  52m ISOVUE-300 IOPAMIDOL (ISOVUE-300) INJECTION 61% COMPARISON:  02/16/2014 FINDINGS: Lower chest:  No significant abnormality Hepatobiliary: There are normal appearances of the liver, gallbladder and bile ducts. Pancreas: Normal Spleen: Norm Adrenals/Urinary Tract: The adrenals and kidneys are normal in appearance. There is no urinary calculus evident. There is no hydronephrosis or ureteral dilatation. Collecting systems and ureters appear unremarkable. Stomach/Bowel: The stomach is normal. There is abnormal dilatation of distal small bowel, continuing all the way to  the ileocecal valve. There is fecalization of content within the terminal ileum. The colon is decompressed, with loss of haustral detail and mural stratification typical of long-standing colitis. There is no extraluminal air. There is no abscess or drainable fluid collection. Vascular/Lymphatic: The abdominal aorta is normal in caliber. There is no atherosclerotic calcification. There is no adenopathy in the abdomen or pelvis. Reproductive: Unremarkable uterus and ovaries Other: No ascites. Musculoskeletal: No significant abnormality IMPRESSION: The findings are most consistent with a small bowel obstruction at the level of the ileocecal valve. No perforation. No abscess. There are abnormalities of the colon consistent with long-standing colitis. Electronically Signed   By: Andreas Newport M.D.   On: 07/08/2015 02:59    ROS negative except above she has not had any sick contacts or any obvious travel and no new medicines Blood pressure 120/70, pulse 76,  temperature 98.1 F (36.7 C), temperature source Oral, resp. rate 18, height 5' (1.524 m), weight 43.591 kg (96 lb 1.6 oz), last menstrual period 06/11/2015, SpO2 99 %. Physical Exam vital signs stable afebrile no acute distress exam pertinent for abdomen being soft nontender good bowel sounds white count and x-ray improved CT reviewed  Assessment/Plan: Seemingly resolved small bowel obstruction Plan: We will clamp NG begin clear liquids if well tolerated can remove NG and very slowly advance diet and in the meantime will decrease her steroid interval and can resume her 5-ASA when tolerating by mouth and I asked the hospital team to get her last colonoscopies from [place] yesterday but they did not so I will order them today and check on tomorrow and she will have to decide whether she wants to follow up here or re-follow-up in [place] with her previous GI group  Williston E 07/09/2015, 1:55 PM

## 2015-07-09 NOTE — Progress Notes (Addendum)
[facility] PROGRESS NOTE  Amanda Dominguez VWU:[id number] DOB: April 22, 1990 DOA: 07/08/2015 PCP: No PCP Per Patient  Assessment/Plan: Amanda MartinLauren Dominguez is a 25 y.o. female past medical history ulcerative colitis on mesalamine comes into the ED complaining of severe abdominal pain and nausea and vomiting that started on the day of admission. She denies any diarrhea or any bloody diarrhea.  1-Partial SBO, Ulcerative colitis Flare;  Continue with IV fluids, IV antibiotics IV steroids.  NG tube in place. Check BUB.  GI, [facility] consulted.  WBC normalized.   2-Hypokalemia; replaced.   3-GI prophylaxis,.  IV protonix.   4-Anemia; drop in hb might be hemoconcentration on admission. Hold heparin for DVT prophylax. IV protonix. Fluids from NG tube dark.  Repeat labs in am.   Code Status: Full Code.  Family Communication: care discussed with mother Ms Chilton SiGreen over phone Disposition Plan: remain inpatient.    Consultants:  GI  Procedures:  none  Antibiotics:  Cipro  Flagyl    HPI/Subjective: She is feeling a little better. She had small BM last night, passing gas.  throat in fire  Abdominal pain is better 6/10/   Objective: Filed Vitals:   07/08/15 2250 07/09/15 0535  BP: 116/75 100/60  Pulse: 71 78  Temp: 98.2 F (36.8 C) 98.1 F (36.7 C)  Resp: 15 16    Intake/Output Summary (Last 24 hours) at 07/09/15 1009 Last data filed at 07/09/15 [id number]  Gross per 24 hour  Intake 2078.33 ml  Output    775 ml  Net 1303.33 ml   Filed Weights   07/08/15 0011 07/09/15 0535  Weight: 43.092 kg (95 lb) 43.591 kg (96 lb 1.6 oz)    Exam:   General:  Alert in no distress  Cardiovascular: S 1 S 2 RRR  Respiratory: CTA  Abdomen: BS present, soft, mild tenderness  Musculoskeletal: no edema  Data Reviewed: Basic Metabolic Panel:  Recent Labs Lab 07/08/15 0045 07/08/15 0844 07/09/15 0435  NA 137  --  138  K 3.3*  --  4.1  CL 105  --  108  CO2 21*  --  23  GLUCOSE  119*  --  156*  BUN 15  --  7  CREATININE 0.61 0.49 0.49  CALCIUM 10.0  --  8.9   Liver Function Tests:  Recent Labs Lab 07/08/15 0045 07/09/15 0435  AST 28 17  ALT 18 16  ALKPHOS 44 29*  BILITOT 1.0 0.4  PROT 8.4* 6.3*  ALBUMIN 5.1* 3.7    Recent Labs Lab 07/08/15 0045  LIPASE 19   No results for input(s): AMMONIA in the last 168 hours. CBC:  Recent Labs Lab 07/08/15 0045 07/08/15 0844 07/09/15 0435  WBC 12.7* 11.4* 7.0  NEUTROABS 10.8*  --   --   HGB 14.4 12.0 11.4*  HCT 42.2 35.6* 33.7*  MCV 87.2 89.4 86.6  PLT 170 169 130*   Cardiac Enzymes: No results for input(s): CKTOTAL, CKMB, CKMBINDEX, TROPONINI in the last 168 hours. BNP (last 3 results) No results for input(s): BNP in the last 8760 hours.  ProBNP (last 3 results) No results for input(s): PROBNP in the last 8760 hours.  CBG: No results for input(s): GLUCAP in the last 168 hours.  No results found for this or any previous visit (from the past 240 hour(s)).   Studies: Ct Abdomen Pelvis W Contrast  07/08/2015  CLINICAL DATA:  Nausea vomiting and diarrhea. Abdominal pain. Duration 12 hours. EXAM: CT ABDOMEN AND PELVIS WITH CONTRAST TECHNIQUE: Multidetector  CT imaging of the abdomen and pelvis was performed using the standard protocol following bolus administration of intravenous contrast. CONTRAST:  90mL ISOVUE-300 IOPAMIDOL (ISOVUE-300) INJECTION 61% COMPARISON:  02/16/2014 FINDINGS: Lower chest:  No significant abnormality Hepatobiliary: There are normal appearances of the liver, gallbladder and bile ducts. Pancreas: Normal Spleen: Norm Adrenals/Urinary Tract: The adrenals and kidneys are normal in appearance. There is no urinary calculus evident. There is no hydronephrosis or ureteral dilatation. Collecting systems and ureters appear unremarkable. Stomach/Bowel: The stomach is normal. There is abnormal dilatation of distal small bowel, continuing all the way to the ileocecal valve. There is fecalization  of content within the terminal ileum. The colon is decompressed, with loss of haustral detail and mural stratification typical of long-standing colitis. There is no extraluminal air. There is no abscess or drainable fluid collection. Vascular/Lymphatic: The abdominal aorta is normal in caliber. There is no atherosclerotic calcification. There is no adenopathy in the abdomen or pelvis. Reproductive: Unremarkable uterus and ovaries Other: No ascites. Musculoskeletal: No significant abnormality IMPRESSION: The findings are most consistent with a small bowel obstruction at the level of the ileocecal valve. No perforation. No abscess. There are abnormalities of the colon consistent with long-standing colitis. Electronically Signed   By: Ellery Plunk M.D.   On: 07/08/2015 02:59    Scheduled Meds: . antiseptic oral rinse  7 mL Mouth Rinse BID  . chlorproMAZINE (THORAZINE) IV  25 mg Intravenous 3 times per day  . ciprofloxacin  400 mg Intravenous Q12H  . heparin  5,000 Units Subcutaneous 3 times per day  . methylPREDNISolone (SOLU-MEDROL) injection  60 mg Intravenous Q6H  . metronidazole  500 mg Intravenous Q8H   Continuous Infusions: . dextrose 5 % and 0.45 % NaCl with KCl 40 mEq/L 1,000 mL (07/09/15 0336)    Active Problems:   SBO (small bowel obstruction) (HCC)   Hypokalemia   Ulcerative colitis (HCC)    Time spent:25 minutes.     Hartley Barefoot A  [facility] Pager [phone]. If 7PM-7AM, please contact night-coverage at [email or web address], password [facility] 07/09/2015, 10:09 AM  LOS: 1 day

## 2015-07-10 LAB — BASIC METABOLIC PANEL
ANION GAP: 10 (ref 5–15)
BUN: 8 mg/dL (ref 6–20)
CHLORIDE: 105 mmol/L (ref 101–111)
CO2: 26 mmol/L (ref 22–32)
Calcium: 9.1 mg/dL (ref 8.9–10.3)
Creatinine, Ser: 0.52 mg/dL (ref 0.44–1.00)
GFR calc Af Amer: >60 mL/min (ref >60)
Glucose, Bld: 196 mg/dL — ABNORMAL HIGH (ref 65–99)
POTASSIUM: 3.6 mmol/L (ref 3.5–5.1)
SODIUM: 141 mmol/L (ref 135–145)

## 2015-07-10 LAB — CBC
HEMATOCRIT: 34.9 % — AB (ref 36.0–46.0)
HEMOGLOBIN: 11.6 g/dL — AB (ref 12.0–15.0)
MCH: 29.7 pg (ref 26.0–34.0)
MCHC: 33.2 g/dL (ref 30.0–36.0)
MCV: 89.3 fL (ref 78.0–100.0)
Platelets: 143 10*3/uL — ABNORMAL LOW (ref 150–400)
RBC: 3.91 MIL/uL (ref 3.87–5.11)
RDW: 13.1 % (ref 11.5–15.5)
WBC: 14.1 10*3/uL — AB (ref 4.0–10.5)

## 2015-07-10 MED ORDER — LORAZEPAM 1 MG PO TABS
1.0000 mg | ORAL_TABLET | Freq: Every day | ORAL | Status: DC | PRN
  Administered 2015-07-11 (×2): 1 mg via ORAL
  Filled 2015-07-10 (×4): qty 1

## 2015-07-10 MED ORDER — METHYLPREDNISOLONE SODIUM SUCC 40 MG IJ SOLR
40.0000 mg | Freq: Three times a day (TID) | INTRAMUSCULAR | Status: DC
  Administered 2015-07-10 – 2015-07-12 (×5): 40 mg via INTRAVENOUS
  Filled 2015-07-10 (×8): qty 1

## 2015-07-10 NOTE — Progress Notes (Signed)
[facility] PROGRESS NOTE  Doreatha Martin XBJ:[id number] DOB: 09-22-1990 DOA: 07/08/2015 PCP: No PCP Per Patient  Assessment/Plan: Amanda Dominguez is a 25 y.o. female past medical history ulcerative colitis on mesalamine comes into the ED complaining of severe abdominal pain and nausea and vomiting that started on the day of admission. She denies any diarrhea or any bloody diarrhea.  1-Partial SBO, Ulcerative colitis Flare: Continue with IV fluids, IV antibiotics IV steroids.  NG tube in place. BUB normal Bowel pattern.  Appreciate Dr Ewing Schlein help,  WBC increase today, but patient is feeling better, no nausea since last night, sipping small amount of liquids.  If tolerates lunch , will discontinue Ng.   2-Hypokalemia; replaced.   3-GI prophylaxis,.  IV protonix.   4-Anemia; drop in hb might be hemoconcentration on admission. Hold heparin for DVT prophylax. IV protonix. Fluids from NG tube dark.  Hb stable.   Code Status: Full Code.  Family Communication: care discussed with mother Ms Chilton Si over phone Disposition Plan: remain inpatient.    Consultants:  GI  Procedures:  none  Antibiotics:  Cipro  Flagyl    HPI/Subjective: No more nausea. She think she had anxiety attack last night.  Abdominal pain is better, still requiring on and off pain meds.  She has had 3 BM so far.   Objective: Filed Vitals:   07/09/15 2220 07/10/15 0540  BP: 124/83 129/80  Pulse: 72 63  Temp: 98.2 F (36.8 C) 98 F (36.7 C)  Resp: 18 18    Intake/Output Summary (Last 24 hours) at 07/10/15 1221 Last data filed at 07/10/15 0600  Gross per 24 hour  Intake 1483.33 ml  Output      0 ml  Net 1483.33 ml   Filed Weights   07/08/15 0011 07/09/15 0535 07/10/15 0511  Weight: 43.092 kg (95 lb) 43.591 kg (96 lb 1.6 oz) 47.492 kg (104 lb 11.2 oz)    Exam:   General:  Alert in no distress  Cardiovascular: S 1 S 2 RRR  Respiratory: CTA  Abdomen: BS present, soft, mild  tenderness  Musculoskeletal: no edema  Data Reviewed: Basic Metabolic Panel:  Recent Labs Lab 07/08/15 0045 07/08/15 0844 07/09/15 0435 07/10/15 0435  NA 137  --  138 141  K 3.3*  --  4.1 3.6  CL 105  --  108 105  CO2 21*  --  23 26  GLUCOSE 119*  --  156* 196*  BUN 15  --  7 8  CREATININE 0.61 0.49 0.49 0.52  CALCIUM 10.0  --  8.9 9.1   Liver Function Tests:  Recent Labs Lab 07/08/15 0045 07/09/15 0435  AST 28 17  ALT 18 16  ALKPHOS 44 29*  BILITOT 1.0 0.4  PROT 8.4* 6.3*  ALBUMIN 5.1* 3.7    Recent Labs Lab 07/08/15 0045  LIPASE 19   No results for input(s): AMMONIA in the last 168 hours. CBC:  Recent Labs Lab 07/08/15 0045 07/08/15 0844 07/09/15 0435 07/10/15 0435  WBC 12.7* 11.4* 7.0 14.1*  NEUTROABS 10.8*  --   --   --   HGB 14.4 12.0 11.4* 11.6*  HCT 42.2 35.6* 33.7* 34.9*  MCV 87.2 89.4 86.6 89.3  PLT 170 169 130* 143*   Cardiac Enzymes: No results for input(s): CKTOTAL, CKMB, CKMBINDEX, TROPONINI in the last 168 hours. BNP (last 3 results) No results for input(s): BNP in the last 8760 hours.  ProBNP (last 3 results) No results for input(s): PROBNP in the last  8760 hours.  CBG: No results for input(s): GLUCAP in the last 168 hours.  No results found for this or any previous visit (from the past 240 hour(s)).   Studies: Dg Abd 1 View  07/09/2015  CLINICAL DATA:  Follow-up small bowel obstruction EXAM: ABDOMEN - 1 VIEW COMPARISON:  CT scan 07/08/2015 FINDINGS: There is normal small bowel gas pattern. Contrast material from yesterday CT scan noted in right colon. NG tube in place with tip in proximal stomach. IMPRESSION: Normal small bowel gas pattern. NG tube in place. Contrast material from yesterday CT scan noted in right colon. Electronically Signed   By: Natasha MeadLiviu  Pop M.D.   On: 07/09/2015 11:34    Scheduled Meds: . antiseptic oral rinse  7 mL Mouth Rinse BID  . ciprofloxacin  400 mg Intravenous Q12H  . fluconazole (DIFLUCAN) IV  100  mg Intravenous Q24H  . methylPREDNISolone (SOLU-MEDROL) injection  60 mg Intravenous Q8H  . metronidazole  500 mg Intravenous Q8H  . pantoprazole (PROTONIX) IV  40 mg Intravenous Q12H   Continuous Infusions: . dextrose 5 % and 0.9% NaCl 125 mL/hr at 07/09/15 2208    Active Problems:   SBO (small bowel obstruction) (HCC)   Hypokalemia   Ulcerative colitis (HCC)    Time spent:25 minutes.     Hartley Barefootegalado, Amor Hyle A  [facility] Pager [phone]. If 7PM-7AM, please contact night-coverage at [email or web address], password [email or web address] 07/10/2015, 12:21 PM  LOS: 2 days

## 2015-07-10 NOTE — Progress Notes (Signed)
Amanda MartinLauren Cloninger 2:29 PM  Subjective: Patient doing better tolerating clear liquids less pain and nausea unfortunately old colonoscopy reports still not obtained  Objective: Vital signs stable afebrile no acute distress abdomen is soft essentially nontender occasional bowel sounds white count increased probably steroids  Assessment: Resolving small bowel obstruction questionably etiology  Plan: If doing well tomorrow morning can advance to full liquids and hopefully discharge soon and would use 40 mg of prednisone once a day and have her follow-up in one week with her primary gastroenterologist in [place] and continue Lialda and the above was discussed with her and her boyfriend and we discussed low residue diet and soft solids at home and please call me if I could be of any further assistance during this hospital stay  [facility] LPMAGOD,Lexany Belknap E  Pager [phone] After 5PM or if no answer call [phone]

## 2015-07-10 NOTE — Progress Notes (Signed)
Patient c/o nausea & pain. Patient reconnected to NG low intermediate suction per order

## 2015-07-11 DIAGNOSIS — K5669 Other intestinal obstruction: Secondary | ICD-10-CM

## 2015-07-11 LAB — BASIC METABOLIC PANEL
ANION GAP: 9 (ref 5–15)
BUN: 9 mg/dL (ref 6–20)
CALCIUM: 8.7 mg/dL — AB (ref 8.9–10.3)
CO2: 28 mmol/L (ref 22–32)
Chloride: 101 mmol/L (ref 101–111)
Creatinine, Ser: 0.54 mg/dL (ref 0.44–1.00)
GFR calc non Af Amer: >60 mL/min (ref >60)
GLUCOSE: 167 mg/dL — AB (ref 65–99)
POTASSIUM: 3.7 mmol/L (ref 3.5–5.1)
Sodium: 138 mmol/L (ref 135–145)

## 2015-07-11 LAB — CBC
HEMATOCRIT: 36.3 % (ref 36.0–46.0)
HEMOGLOBIN: 12.1 g/dL (ref 12.0–15.0)
MCH: 30 pg (ref 26.0–34.0)
MCHC: 33.3 g/dL (ref 30.0–36.0)
MCV: 89.9 fL (ref 78.0–100.0)
Platelets: 135 10*3/uL — ABNORMAL LOW (ref 150–400)
RBC: 4.04 MIL/uL (ref 3.87–5.11)
RDW: 13 % (ref 11.5–15.5)
WBC: 11.5 10*3/uL — ABNORMAL HIGH (ref 4.0–10.5)

## 2015-07-11 MED ORDER — HYDROCODONE-ACETAMINOPHEN 5-325 MG PO TABS
1.0000 | ORAL_TABLET | Freq: Four times a day (QID) | ORAL | Status: DC | PRN
Start: 2015-07-11 — End: 2015-07-12
  Administered 2015-07-11 – 2015-07-12 (×3): 2 via ORAL
  Filled 2015-07-11 (×3): qty 2

## 2015-07-11 MED ORDER — SODIUM CHLORIDE 0.9 % IV SOLN
INTRAVENOUS | Status: DC
  Administered 2015-07-11: 18:00:00 via INTRAVENOUS

## 2015-07-11 MED ORDER — HYDROMORPHONE HCL 1 MG/ML IJ SOLN
1.0000 mg | INTRAMUSCULAR | Status: DC | PRN
  Administered 2015-07-11 – 2015-07-12 (×3): 1 mg via INTRAVENOUS
  Filled 2015-07-11 (×3): qty 1

## 2015-07-11 MED ORDER — CIPROFLOXACIN HCL 500 MG PO TABS
500.0000 mg | ORAL_TABLET | Freq: Two times a day (BID) | ORAL | Status: DC
  Administered 2015-07-11: 500 mg via ORAL
  Filled 2015-07-11 (×4): qty 1

## 2015-07-11 MED ORDER — PANTOPRAZOLE SODIUM 40 MG PO TBEC
40.0000 mg | DELAYED_RELEASE_TABLET | Freq: Every day | ORAL | Status: DC
  Filled 2015-07-11: qty 1

## 2015-07-11 MED ORDER — METRONIDAZOLE 500 MG PO TABS
500.0000 mg | ORAL_TABLET | Freq: Three times a day (TID) | ORAL | Status: DC
  Administered 2015-07-11 – 2015-07-12 (×2): 500 mg via ORAL
  Filled 2015-07-11 (×5): qty 1

## 2015-07-11 NOTE — Progress Notes (Signed)
Patient doing fine tolerating clear liquids and probably discharged tomorrow waiting on nutritional team and 1 day of transition to oral medicines and will follow-up with her [place] and please call me if I could be of any further assistance with this hospital stay and unfortunately no fax previous colonoscopy records were received

## 2015-07-11 NOTE — Progress Notes (Signed)
[facility] PROGRESS NOTE  Amanda Dominguez ZOX:[id number] DOB: [birth year] DOA: 07/08/2015 PCP: No PCP Per Patient  Assessment/Plan: Amanda Dominguez is a 25 y.o. female past medical history ulcerative colitis on mesalamine comes into the ED complaining of severe abdominal pain and nausea and vomiting that started on the day of admission. She denies any diarrhea or any bloody diarrhea.  1-Partial SBO, Ulcerative colitis Flare: Continue with IV fluids, IV steroids.  Appreciate Dr Ewing Schlein help,  WBC decreased. Pain better, wiling to advanced diet.  Change Antibiotics to oral. Discharge tomorrow on oral prednisone.  Start oral pain medications.   2-Hypokalemia; replaced.   3-GI prophylaxis,.  IV protonix.   4-Anemia; drop in hb might be hemoconcentration on admission. Hold heparin for DVT prophylax. IV protonix. Fluids from NG tube dark.  Hb stable.   Code Status: Full Code.  Family Communication: care discussed with mother Ms Chilton Si over phone Disposition Plan: remain inpatient.    Consultants:  GI  Procedures:  none  Antibiotics:  Cipro  Flagyl    HPI/Subjective: Had some nausea. Pain over all better , moving her bowel.   Objective: Filed Vitals:   07/11/15 0526 07/11/15 1519  BP: 130/82 132/83  Pulse: 65 73  Temp: 98.7 F (37.1 C) 98.8 F (37.1 C)  Resp: 16 16    Intake/Output Summary (Last 24 hours) at 07/11/15 1604 Last data filed at 07/11/15 1519  Gross per 24 hour  Intake   2860 ml  Output      0 ml  Net   2860 ml   Filed Weights   07/09/15 0535 07/10/15 0511 07/11/15 0526  Weight: 43.591 kg (96 lb 1.6 oz) 47.492 kg (104 lb 11.2 oz) 47.083 kg (103 lb 12.8 oz)    Exam:   General:  Alert in no distress  Cardiovascular: S 1 S 2 RRR  Respiratory: CTA  Abdomen: BS present, soft, mild tenderness  Musculoskeletal: no edema  Data Reviewed: Basic Metabolic Panel:  Recent Labs Lab 07/08/15 0045 07/08/15 0844 07/09/15 0435 07/10/15 0435  07/11/15 0436  NA 137  --  138 141 138  K 3.3*  --  4.1 3.6 3.7  CL 105  --  108 105 101  CO2 21*  --  GLUCOSE 119*  --  156* 196* 167*  BUN 15  --  CREATININE 0.61 0.49 0.49 0.52 0.54  CALCIUM 10.0  --  8.9 9.1 8.7*   Liver Function Tests:  Recent Labs Lab 07/08/15 0045 07/09/15 0435  AST 28 17  ALT 18 16  ALKPHOS 44 29*  BILITOT 1.0 0.4  PROT 8.4* 6.3*  ALBUMIN 5.1* 3.7    Recent Labs Lab 07/08/15 0045  LIPASE 19   No results for input(s): AMMONIA in the last 168 hours. CBC:  Recent Labs Lab 07/08/15 0045 07/08/15 0844 07/09/15 0435 07/10/15 0435 07/11/15 0436  WBC 12.7* 11.4* 7.0 14.1* 11.5*  NEUTROABS 10.8*  --   --   --   --   HGB 14.4 12.0 11.4* 11.6* 12.1  HCT 42.2 35.6* 33.7* 34.9* 36.3  MCV 87.2 89.4 86.6 89.3 89.9  PLT 170 169 130* 143* 135*   Cardiac Enzymes: No results for input(s): CKTOTAL, CKMB, CKMBINDEX, TROPONINI in the last 168 hours. BNP (last 3 results) No results for input(s): BNP in the last 8760 hours.  ProBNP (last 3 results) No results for input(s): PROBNP in the last 8760 hours.  CBG: No results for input(s):  GLUCAP in the last 168 hours.  No results found for this or any previous visit (from the past 240 hour(s)).   Studies: No results found.  Scheduled Meds: . antiseptic oral rinse  7 mL Mouth Rinse BID  . ciprofloxacin  500 mg Oral BID  . methylPREDNISolone (SOLU-MEDROL) injection  40 mg Intravenous Q8H  . metroNIDAZOLE  500 mg Oral 3 times per day  . pantoprazole  40 mg Oral Daily   Continuous Infusions: . dextrose 5 % and 0.9% NaCl 100 mL/hr at 07/11/15 1400    Active Problems:   SBO (small bowel obstruction) (HCC)   Hypokalemia   Ulcerative colitis (HCC)    Time spent:25 minutes.     Hartley Barefootegalado, Alysiana Ethridge A  [facility] Pager [phone]. If 7PM-7AM, please contact night-coverage at [email or web address], password [email or web address] 07/11/2015, 4:04 PM  LOS: 3 days

## 2015-07-12 MED ORDER — METRONIDAZOLE 500 MG PO TABS
500.0000 mg | ORAL_TABLET | Freq: Three times a day (TID) | ORAL | Status: DC
Start: 2015-07-12 — End: 2016-06-02

## 2015-07-12 MED ORDER — PREDNISONE 20 MG PO TABS: ORAL_TABLET | ORAL | Status: DC

## 2015-07-12 MED ORDER — PANTOPRAZOLE SODIUM 40 MG PO TBEC: 40.0000 mg | DELAYED_RELEASE_TABLET | Freq: Every day | ORAL | Status: DC

## 2015-07-12 MED ORDER — CIPROFLOXACIN HCL 500 MG PO TABS: 500.0000 mg | ORAL_TABLET | Freq: Two times a day (BID) | ORAL | Status: DC

## 2015-07-12 NOTE — Discharge Summary (Signed)
Physician Discharge Summary  Amanda Dominguez DOB: May 24, 1990 DOA: 07/08/2015  PCP: No PCP Per Patient  Admit date: 07/08/2015 Discharge date: 07/12/2015  Time spent: 35 minutes  Recommendations for Outpatient Follow-up:   Patient Left AMA. I dint have change to speak with her.  I provided prescription for prednisone and antibiotics.   1. Follow up with primary Gastroenterologist.    Discharge Diagnoses:    Ulcerative colitis flare   SBO (small bowel obstruction) (HCC)   Hypokalemia     Diet recommendation: low residue diet   Filed Weights   07/09/15 0535 07/10/15 0511 07/11/15 0526  Weight: 43.591 kg (96 lb 1.6 oz) 47.492 kg (104 lb 11.2 oz) 47.083 kg (103 lb 12.8 oz)    History of present illness:  Amanda Dominguez is a 25 y.o. female past medical history ulcerative colitis on mesalamine comes into the ED complaining of severe abdominal pain and nausea and vomiting that started on the day of admission. She denies any diarrhea or any bloody diarrhea. Her emesis has had no blood. Denies any new medication she relates she has been taking her mesalamine, denies any vaginal bleeding dysuria burning when he urinates. She denies any rashes. Her sick contacts. She is never had abdominal surgery.  In the ED: Basic metabolic panel showed mild hypokalemia she had a mild elevation in her lactic acid, with mild leukocytosis and left shift, UA did not show signs of infection, but she did had a CT scan of the abdomen and pelvis as below.  Hospital Course:  Amanda Dominguez is a 25 y.o. female past medical history ulcerative colitis on mesalamine comes into the ED complaining of severe abdominal pain and nausea and vomiting that started on the day of admission. She denies any diarrhea or any bloody diarrhea.  1-Partial SBO, Ulcerative colitis Flare: Continue with IV fluids, IV steroids.  Appreciate Dr Ewing Schlein help,  WBC decreased. Pain better, wiling to advanced diet.  Change  Antibiotics to oral. Discharge tomorrow on oral prednisone.  Started  oral pain medications.  I provided prescription for prednisone and antibiotics.  Patient left AMA  2-Hypokalemia; replaced.   3-GI prophylaxis,.  IV protonix.   4-Anemia; drop in hb might be hemoconcentration on admission. Hold heparin for DVT prophylax. IV protonix. Fluids from NG tube dark.  Hb stable.   Procedures:  none  Consultations:  GI   Discharge Exam: Filed Vitals:   07/11/15 2120 07/12/15 0552  BP: 107/75 142/91  Pulse: 76 81  Temp: 98.8 F (37.1 C) 98.2 F (36.8 C)  Resp: 16 18    PE; unable to evaluate patient , she left AMA  Discharge Instructions    Discharge Medication List as of 07/12/2015  9:02 AM    START taking these medications   Details  ciprofloxacin (CIPRO) 500 MG tablet Take 1 tablet (500 mg total) by mouth 2 (two) times daily., Starting 07/12/2015, Until Discontinued, Print    metroNIDAZOLE (FLAGYL) 500 MG tablet Take 1 tablet (500 mg total) by mouth every 8 (eight) hours., Starting 07/12/2015, Until Discontinued, Print    pantoprazole (PROTONIX) 40 MG tablet Take 1 tablet (40 mg total) by mouth daily., Starting 07/12/2015, Until Discontinued, Print    predniSONE (DELTASONE) 20 MG tablet Take 40 mg daily, Print      CONTINUE these medications which have NOT CHANGED   Details  mesalamine (LIALDA) 1.2 g EC tablet Take 1.2 g by mouth 3 (three) times daily. , Until Discontinued, Historical Med  HYDROcodone-acetaminophen (NORCO/VICODIN) 5-325 MG per tablet Take 2 tablets by mouth every 4 (four) hours as needed., Starting 09/26/2013, Until Discontinued, Print    LORazepam (ATIVAN) 1 MG tablet Take 1 tablet (1 mg total) by mouth every 6 (six) hours as needed for anxiety., Starting 04/22/2015, Until Discontinued, Print      STOP taking these medications     benzonatate (TESSALON) 100 MG capsule      oseltamivir (TAMIFLU) 75 MG capsule        No Known  Allergies    The results of significant diagnostics from this hospitalization (including imaging, microbiology, ancillary and laboratory) are listed below for reference.    Significant Diagnostic Studies: Dg Abd 1 View  07/09/2015  CLINICAL DATA:  Follow-up small bowel obstruction EXAM: ABDOMEN - 1 VIEW COMPARISON:  CT scan 07/08/2015 FINDINGS: There is normal small bowel gas pattern. Contrast material from yesterday CT scan noted in right colon. NG tube in place with tip in proximal stomach. IMPRESSION: Normal small bowel gas pattern. NG tube in place. Contrast material from yesterday CT scan noted in right colon. Electronically Signed   By: Natasha MeadLiviu  Pop M.D.   On: 07/09/2015 11:34   Ct Abdomen Pelvis W Contrast  07/08/2015  CLINICAL DATA:  Nausea vomiting and diarrhea. Abdominal pain. Duration 12 hours. EXAM: CT ABDOMEN AND PELVIS WITH CONTRAST TECHNIQUE: Multidetector CT imaging of the abdomen and pelvis was performed using the standard protocol following bolus administration of intravenous contrast. CONTRAST:  90mL ISOVUE-300 IOPAMIDOL (ISOVUE-300) INJECTION 61% COMPARISON:  02/16/2014 FINDINGS: Lower chest:  No significant abnormality Hepatobiliary: There are normal appearances of the liver, gallbladder and bile ducts. Pancreas: Normal Spleen: Norm Adrenals/Urinary Tract: The adrenals and kidneys are normal in appearance. There is no urinary calculus evident. There is no hydronephrosis or ureteral dilatation. Collecting systems and ureters appear unremarkable. Stomach/Bowel: The stomach is normal. There is abnormal dilatation of distal small bowel, continuing all the way to the ileocecal valve. There is fecalization of content within the terminal ileum. The colon is decompressed, with loss of haustral detail and mural stratification typical of long-standing colitis. There is no extraluminal air. There is no abscess or drainable fluid collection. Vascular/Lymphatic: The abdominal aorta is normal in  caliber. There is no atherosclerotic calcification. There is no adenopathy in the abdomen or pelvis. Reproductive: Unremarkable uterus and ovaries Other: No ascites. Musculoskeletal: No significant abnormality IMPRESSION: The findings are most consistent with a small bowel obstruction at the level of the ileocecal valve. No perforation. No abscess. There are abnormalities of the colon consistent with long-standing colitis. Electronically Signed   By: Ellery Plunkaniel R Mitchell M.D.   On: 07/08/2015 02:59    Microbiology: No results found for this or any previous visit (from the past 240 hour(s)).   Labs: Basic Metabolic Panel:  Recent Labs Lab 07/08/15 0045 07/08/15 0844 07/09/15 0435 07/10/15 0435 07/11/15 0436  NA 137  --  138 141 138  K 3.3*  --  4.1 3.6 3.7  CL 105  --  108 105 101  CO2 21*  --  23 26 28   GLUCOSE 119*  --  156* 196* 167*  BUN 15  --  7 8 9   CREATININE 0.61 0.49 0.49 0.52 0.54  CALCIUM 10.0  --  8.9 9.1 8.7*   Liver Function Tests:  Recent Labs Lab 07/08/15 0045 07/09/15 0435  AST 28 17  ALT 18 16  ALKPHOS 44 29*  BILITOT 1.0 0.4  PROT 8.4* 6.3*  ALBUMIN 5.1*  3.7    Recent Labs Lab 07/08/15 0045  LIPASE 19   No results for input(s): AMMONIA in the last 168 hours. CBC:  Recent Labs Lab 07/08/15 0045 07/08/15 0844 07/09/15 0435 07/10/15 0435 07/11/15 0436  WBC 12.7* 11.4* 7.0 14.1* 11.5*  NEUTROABS 10.8*  --   --   --   --   HGB 14.4 12.0 11.4* 11.6* 12.1  HCT 42.2 35.6* 33.7* 34.9* 36.3  MCV 87.2 89.4 86.6 89.3 89.9  PLT 170 169 130* 143* 135*   Cardiac Enzymes: No results for input(s): CKTOTAL, CKMB, CKMBINDEX, TROPONINI in the last 168 hours. BNP: BNP (last 3 results) No results for input(s): BNP in the last 8760 hours.  ProBNP (last 3 results) No results for input(s): PROBNP in the last 8760 hours.  CBG: No results for input(s): GLUCAP in the last 168 hours.     Signed:  Alba Cory MD.  [facility] 07/12/2015,  2:16 PM

## 2015-07-12 NOTE — Progress Notes (Signed)
Pt reported to nurse she is having anxiety attack and wants to leave. Pt told she can have medication for it along with something for pain, she stated "I will take it but I am still leaving".  Educated pt on best reasons to stay, pt verbalized understanding but stated her mother was on way to pick her up.  Pt signed AMA form, IV d/c'd, and wheeled to front entrance with personal belongings.

## 2015-11-01 ENCOUNTER — Emergency Department (HOSPITAL_BASED_OUTPATIENT_CLINIC_OR_DEPARTMENT_OTHER)
Admission: EM | Admit: 2015-11-01 | Discharge: 2015-11-01 | Disposition: A | Discharge status: Home / Self Care | Payer: Medicaid Other | Attending: Emergency Medicine | Admitting: Emergency Medicine

## 2015-11-01 ENCOUNTER — Emergency Department (HOSPITAL_BASED_OUTPATIENT_CLINIC_OR_DEPARTMENT_OTHER): Payer: Medicaid Other

## 2015-11-01 ENCOUNTER — Encounter (HOSPITAL_BASED_OUTPATIENT_CLINIC_OR_DEPARTMENT_OTHER): Payer: Self-pay

## 2015-11-01 DIAGNOSIS — Z3A01 Less than 8 weeks gestation of pregnancy: Secondary | ICD-10-CM | POA: Diagnosis not present

## 2015-11-01 DIAGNOSIS — Z3490 Encounter for supervision of normal pregnancy, unspecified, unspecified trimester: Secondary | ICD-10-CM

## 2015-11-01 DIAGNOSIS — O26891 Other specified pregnancy related conditions, first trimester: Secondary | ICD-10-CM | POA: Diagnosis not present

## 2015-11-01 DIAGNOSIS — N898 Other specified noninflammatory disorders of vagina: Secondary | ICD-10-CM | POA: Diagnosis not present

## 2015-11-01 DIAGNOSIS — F17210 Nicotine dependence, cigarettes, uncomplicated: Secondary | ICD-10-CM | POA: Diagnosis not present

## 2015-11-01 DIAGNOSIS — O219 Vomiting of pregnancy, unspecified: Secondary | ICD-10-CM

## 2015-11-01 LAB — URINE MICROSCOPIC-ADD ON: RBC / HPF: NONE SEEN RBC/hpf (ref 0–5)

## 2015-11-01 LAB — URINALYSIS, ROUTINE W REFLEX MICROSCOPIC
Bilirubin Urine: NEGATIVE
Glucose, UA: NEGATIVE mg/dL
Hgb urine dipstick: NEGATIVE
KETONES UR: NEGATIVE mg/dL
NITRITE: NEGATIVE
PROTEIN: NEGATIVE mg/dL
Specific Gravity, Urine: 1.019 (ref 1.005–1.030)
pH: 8.5 — ABNORMAL HIGH (ref 5.0–8.0)

## 2015-11-01 LAB — HCG, QUANTITATIVE, PREGNANCY: hCG, Beta Chain, Quant, S: 64200 m[IU]/mL — ABNORMAL HIGH (ref <5)

## 2015-11-01 LAB — ABO/RH: ABO/RH(D): AB POS

## 2015-11-01 LAB — PREGNANCY, URINE: PREG TEST UR: POSITIVE — AB

## 2015-11-01 MED ORDER — METOCLOPRAMIDE HCL 10 MG PO TABS: 10.0000 mg | ORAL_TABLET | Freq: Three times a day (TID) | ORAL | 0 refills | Status: DC | PRN

## 2015-11-01 NOTE — ED Notes (Signed)
Patient left ED ambulatory and in NAD. Patient denies any further questions about d/c at this time.

## 2015-11-01 NOTE — ED Notes (Addendum)
Mentions: "do not have an OB/GYN, admits to having a PCP". Given snack.

## 2015-11-01 NOTE — ED Notes (Signed)
Bed set up for pelvic exam with cart in room and stirrups in bed.

## 2015-11-01 NOTE — ED Notes (Signed)
Patient transported to Ultrasound 

## 2015-11-01 NOTE — ED Provider Notes (Signed)
[facility]-EMERGENCY DEPT Provider Note   CSN: [id number] Arrival date & time: 11/01/15  1340  By signing my name below, I, [name], attest that this documentation has been prepared under the direction and in the presence of Loren Racer, MD. Electronically Signed: [name], ED Scribe. 11/01/15. 5:50 PM.  First MD Initiated Contact with Patient 11/01/15 1731    History   Chief Complaint Chief Complaint  Patient presents with  . Nausea    The history is provided by the patient. No language interpreter was used.    HPI Comments: Amanda Dominguez is a 25 y.o. female with PMHx of Anxiety and Ulcerative Colitis who presents to the Emergency Department complaining of gradual onset, constant, moderate nausea onset a few days ago. Pt reports associated urinary frequency, breast soreness, and generalized body aches. Pt states her back has been feeling "as if it was broken" for 2 weeks. 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 Pt states her last menstrual cycle was on 10/19/15. Pt states she had unusual spotting for that menstrual cycle which was similar to when she was pregnant with her previous child. She reports she has had recent unprotected sex. Pt reports her last normal menstrual cycle was 09/13/15. Pt denies fever, shaking, chills, dysuria, blood in stool. No vaginal discharge.   Past Medical History:  Diagnosis Date  . Anxiety   . Colitis     Patient Active Problem List   Diagnosis Date Noted  . SBO (small bowel obstruction) (HCC) 07/08/2015  . Hypokalemia 07/08/2015  . Ulcerative colitis (HCC) 07/08/2015    History reviewed. No pertinent surgical history.  OB History    No data available       Home Medications    Prior to Admission medications   Medication Sig Start Date End Date Taking? Authorizing Provider  ciprofloxacin (CIPRO) 500 MG tablet Take 1 tablet (500 mg total) by mouth 2 (two) times daily. 07/12/15   Belkys A  Regalado, MD  HYDROcodone-acetaminophen (NORCO/VICODIN) 5-325 MG per tablet Take 2 tablets by mouth every 4 (four) hours as needed. Patient not taking: Reported on 07/08/2015 09/26/13   Elson Areas, PA-C  LORazepam (ATIVAN) 1 MG tablet Take 1 tablet (1 mg total) by mouth every 6 (six) hours as needed for anxiety. Patient not taking: Reported on 07/08/2015 04/22/15   Geoffery Lyons, MD  mesalamine (LIALDA) 1.2 g EC tablet Take 1.2 g by mouth 3 (three) times daily.     Historical Provider, MD  metoCLOPramide (REGLAN) 10 MG tablet Take 1 tablet (10 mg total) by mouth every 8 (eight) hours as needed for nausea or vomiting. 11/01/15   Loren Racer, MD  metroNIDAZOLE (FLAGYL) 500 MG tablet Take 1 tablet (500 mg total) by mouth every 8 (eight) hours. 07/12/15   Belkys A Regalado, MD  pantoprazole (PROTONIX) 40 MG tablet Take 1 tablet (40 mg total) by mouth daily. 07/12/15   Belkys A Regalado, MD  predniSONE (DELTASONE) 20 MG tablet Take 40 mg daily 07/12/15   Alba Cory, MD    Family History Family History  Problem Relation Age of Onset  . Heart attack Father     Social History Social History  Substance Use Topics  . Smoking status: Current Every Day Smoker    Packs/day: 0.50    Types: Cigarettes  . Smokeless tobacco: Never Used  . Alcohol use No     Allergies   Review of patient's allergies indicates no  known allergies.   Review of Systems Review of Systems  Constitutional: Negative for chills and fever.  Respiratory: Negative for shortness of breath.   Cardiovascular: Negative for chest pain.  Gastrointestinal: Positive for nausea and vomiting. Negative for abdominal pain and blood in stool.  Genitourinary: Positive for frequency. Negative for difficulty urinating, dysuria, flank pain, hematuria, pelvic pain, vaginal bleeding and vaginal discharge.  Musculoskeletal: Positive for back pain and myalgias. Negative for arthralgias.       + Breast Soreness  Skin: Negative for rash  and wound.  Neurological: Negative for dizziness, tremors, weakness, light-headedness and numbness.  All other systems reviewed and are negative.    Physical Exam Updated Vital Signs BP 105/68 (BP Location: Left Arm)   Pulse 65   Temp 98.5 F (36.9 C) (Oral)   Resp 17   Ht 5' (1.524 m)   Wt 95 lb (43.1 kg)   LMP 10/19/2015   SpO2 99%   BMI 18.55 kg/m   Physical Exam  Constitutional: She is oriented to person, place, and time. She appears well-developed and well-nourished.  HENT:  Head: Normocephalic and atraumatic.  Mouth/Throat: Oropharynx is clear and moist.  Eyes: EOM are normal. Pupils are equal, round, and reactive to light.  Neck: Normal range of motion. Neck supple.  Cardiovascular: Normal rate and regular rhythm.   Pulmonary/Chest: Effort normal and breath sounds normal. No respiratory distress. She has no wheezes. She has no rales. She exhibits no tenderness.  Abdominal: Soft. Bowel sounds are normal. She exhibits no distension and no mass. There is no tenderness. There is no rebound and no guarding.  Musculoskeletal: Normal range of motion. She exhibits no edema or tenderness.  No thoracic or midline lumbar tenderness. No CVA tenderness. Very mild bilateral paraspinal lumbar tenderness. Negative straight leg raise bilaterally. Distal pulses are equal and intact.  Neurological: She is alert and oriented to person, place, and time.  5/5 motor in all extremities. Sensation is fully intact. Ambulates without difficulty.  Skin: Skin is warm and dry. Capillary refill takes less than 2 seconds. No rash noted. No erythema.  Psychiatric: She has a normal mood and affect. Her behavior is normal.  Nursing note and vitals reviewed.    ED Treatments / Results  DIAGNOSTIC STUDIES: Oxygen Saturation is 99% on RA, normal by my interpretation.    COORDINATION OF CARE: 5:35 PM Discussed treatment plan with pt at bedside which includes Pelvic exam and pt agreed to  plan.  Labs (all labs ordered are listed, but only abnormal results are displayed) Labs Reviewed  URINALYSIS, ROUTINE W REFLEX MICROSCOPIC (NOT AT [facility]) - Abnormal; Notable for the following:       Result Value   APPearance CLOUDY (*)    pH 8.5 (*)    Leukocytes, UA MODERATE (*)    All other components within normal limits  PREGNANCY, URINE - Abnormal; Notable for the following:    Preg Test, Ur POSITIVE (*)    All other components within normal limits  URINE MICROSCOPIC-ADD ON - Abnormal; Notable for the following:    Squamous Epithelial / LPF 6-30 (*)    Bacteria, UA MANY (*)    All other components within normal limits  HCG, QUANTITATIVE, PREGNANCY - Abnormal; Notable for the following:    hCG, Beta Chain, Quant, S 64,200 (*)    All other components within normal limits  HIV ANTIBODY (ROUTINE TESTING)  ABO/RH  GC/CHLAMYDIA PROBE AMP ([place]) NOT AT [facility]    EKG  EKG Interpretation None       Radiology No results found.  Procedures Procedures (including critical care time)  Medications Ordered in ED Medications - No data to display   Initial Impression / Assessment and Plan / ED Course  I have reviewed the triage vital signs and the nursing notes.  Pertinent labs & imaging results that were available during my care of the patient were reviewed by me and considered in my medical decision making (see chart for details).  Clinical Course   Patient refused pelvic exam. She is advised to follow-up with her primary physician. No vomiting in the emergency department. Tolerating fluids well. Return precautions given.   Final Clinical Impressions(s) / ED Diagnoses   Final diagnoses:  Intrauterine pregnancy  Nausea and vomiting in pregnancy    New Prescriptions Discharge Medication List as of 11/01/2015  8:37 PM    START taking these medications   Details  metoCLOPramide (REGLAN) 10 MG tablet Take 1 tablet (10 mg total) by mouth every 8 (eight) hours as  needed for nausea or vomiting., Starting Sat 11/01/2015, Print       I personally performed the services described in this documentation, which was scribed in my presence. The recorded information has been reviewed and is accurate.       Loren Racer, MD 11/04/15 2204

## 2015-11-01 NOTE — ED Triage Notes (Addendum)
Patient presents to ED with nausea x3 days and chest pains that are intermittent. Denies vomiting. Reports breast, back and body pain and aches. Is uncertain if pregnant.

## 2015-11-01 NOTE — ED Notes (Addendum)
Pt reports extreme nausea x several days, no vomiting. Pt denies abd pain. Denies vaginal discharge or bleeding. Endorses urinary frequency, denies other urinary symptoms.

## 2015-11-01 NOTE — ED Notes (Signed)
Dr. Ranae Palms in to see pt

## 2015-11-03 LAB — HIV ANTIBODY (ROUTINE TESTING W REFLEX): HIV Screen 4th Generation wRfx: NONREACTIVE

## 2016-01-16 ENCOUNTER — Emergency Department (HOSPITAL_BASED_OUTPATIENT_CLINIC_OR_DEPARTMENT_OTHER): Payer: Medicaid Other

## 2016-01-16 ENCOUNTER — Emergency Department (HOSPITAL_BASED_OUTPATIENT_CLINIC_OR_DEPARTMENT_OTHER)
Admission: EM | Admit: 2016-01-16 | Discharge: 2016-01-17 | Disposition: A | Discharge status: Home / Self Care | Payer: Medicaid Other | Attending: Emergency Medicine | Admitting: Emergency Medicine

## 2016-01-16 ENCOUNTER — Encounter (HOSPITAL_BASED_OUTPATIENT_CLINIC_OR_DEPARTMENT_OTHER): Payer: Self-pay | Admitting: *Deleted

## 2016-01-16 DIAGNOSIS — O99332 Smoking (tobacco) complicating pregnancy, second trimester: Secondary | ICD-10-CM | POA: Diagnosis not present

## 2016-01-16 DIAGNOSIS — Z79899 Other long term (current) drug therapy: Secondary | ICD-10-CM | POA: Insufficient documentation

## 2016-01-16 DIAGNOSIS — Z3A18 18 weeks gestation of pregnancy: Secondary | ICD-10-CM | POA: Diagnosis not present

## 2016-01-16 DIAGNOSIS — M545 Low back pain, unspecified: Secondary | ICD-10-CM

## 2016-01-16 DIAGNOSIS — O26892 Other specified pregnancy related conditions, second trimester: Secondary | ICD-10-CM | POA: Insufficient documentation

## 2016-01-16 DIAGNOSIS — R1084 Generalized abdominal pain: Secondary | ICD-10-CM | POA: Insufficient documentation

## 2016-01-16 DIAGNOSIS — F17210 Nicotine dependence, cigarettes, uncomplicated: Secondary | ICD-10-CM | POA: Diagnosis not present

## 2016-01-16 LAB — URINALYSIS, ROUTINE W REFLEX MICROSCOPIC
BILIRUBIN URINE: NEGATIVE
GLUCOSE, UA: 100 mg/dL — AB
Hgb urine dipstick: NEGATIVE
KETONES UR: 15 mg/dL — AB
NITRITE: NEGATIVE
PH: 6.5 (ref 5.0–8.0)
Protein, ur: NEGATIVE mg/dL
SPECIFIC GRAVITY, URINE: 1.024 (ref 1.005–1.030)

## 2016-01-16 LAB — URINE MICROSCOPIC-ADD ON

## 2016-01-16 LAB — WET PREP, GENITAL
SPERM: NONE SEEN
Trich, Wet Prep: NONE SEEN

## 2016-01-16 MED ORDER — ACETAMINOPHEN 500 MG PO TABS
500.0000 mg | ORAL_TABLET | Freq: Once | ORAL | Status: AC
  Administered 2016-01-16: 500 mg via ORAL
  Filled 2016-01-16: qty 1

## 2016-01-16 NOTE — ED Triage Notes (Signed)
[redacted] weeks pregnant. No bleeding or vaginal leakage. Back pain. States it feels like ligament pain.

## 2016-01-16 NOTE — ED Provider Notes (Signed)
[facility] Provider [facility]   CSN: [id number] Arrival date & time: 01/16/16  1907  By signing my name below, I, Modena JanskyAlbert Thayil, attest that this documentation has been prepared under the direction and in the presence of non-physician practitioner, Rise MuKenneth T Meiko Stranahan, PA-C. Electronically Signed: Modena JanskyAlbert Thayil, Scribe. 01/16/2016. 9:56 PM.  History   Chief Complaint Chief Complaint  Patient presents with  . Back Pain   The history is provided by the patient. No language interpreter was used.   HPI Comments: Amanda MartinLauren Dominguez is a 25 y.o. female who presents to the Emergency Department complaining of generalized abdominal pain that started about a week ago ago. Describes the pain as a cramping. Pt states she is currently [redacted] weeks pregnant and is concerned about her baby's health. She describes the pain as radiating to her back. She reports no treatment PTA. She states she last saw her OB/GYN one month ago and will see them again in about 1-2 weeks. Patient states this feels like :"ligmanet pain". States she just wanted to make sure her babies hr was ok. She denies any fever, chills, ha, vision changes, lightheadedness, dizziness, cp, sob, dysuria, hematuria, vaginal bleeding, vaginal discharge, loss of fluids, contractions, or other complaints.   Past Medical History:  Diagnosis Date  . Anxiety   . Colitis     Patient Active Problem List   Diagnosis Date Noted  . SBO (small bowel obstruction) 07/08/2015  . Hypokalemia 07/08/2015  . Ulcerative colitis (HCC) 07/08/2015    History reviewed. No pertinent surgical history.  OB History    Gravida Para Term Preterm AB Living   1             SAB TAB Ectopic Multiple Live Births                   Home Medications    Prior to Admission medications   Medication Sig Start Date End Date Taking? Authorizing Provider  ciprofloxacin (CIPRO) 500 MG tablet Take 1 tablet (500 mg total) by mouth 2 (two) times daily. 07/12/15   Belkys A  Regalado, MD  HYDROcodone-acetaminophen (NORCO/VICODIN) 5-325 MG per tablet Take 2 tablets by mouth every 4 (four) hours as needed. Patient not taking: Reported on 07/08/2015 09/26/13   Elson AreasLeslie K Sofia, PA-C  LORazepam (ATIVAN) 1 MG tablet Take 1 tablet (1 mg total) by mouth every 6 (six) hours as needed for anxiety. Patient not taking: Reported on 07/08/2015 04/22/15   Geoffery Lyonsouglas Delo, MD  mesalamine (LIALDA) 1.2 g EC tablet Take 1.2 g by mouth 3 (three) times daily.     Historical Provider, MD  metoCLOPramide (REGLAN) 10 MG tablet Take 1 tablet (10 mg total) by mouth every 8 (eight) hours as needed for nausea or vomiting. 11/01/15   Loren Raceravid Yelverton, MD  metroNIDAZOLE (FLAGYL) 500 MG tablet Take 1 tablet (500 mg total) by mouth every 8 (eight) hours. 07/12/15   Belkys A Regalado, MD  pantoprazole (PROTONIX) 40 MG tablet Take 1 tablet (40 mg total) by mouth daily. 07/12/15   Belkys A Regalado, MD  predniSONE (DELTASONE) 20 MG tablet Take 40 mg daily 07/12/15   Alba CoryBelkys A Regalado, MD    Family History Family History  Problem Relation Age of Onset  . Heart attack Father     Social History Social History  Substance Use Topics  . Smoking status: Current Every Day Smoker    Packs/day: 0.50    Types: Cigarettes  . Smokeless tobacco: Never Used  .  Alcohol use No     Allergies   Review of patient's allergies indicates no known allergies.   Review of Systems Review of Systems  Constitutional: Negative for fever.  HENT: Negative for congestion and rhinorrhea.   Respiratory: Negative for cough and shortness of breath.   Cardiovascular: Negative for chest pain, palpitations and leg swelling.  Gastrointestinal: Positive for abdominal pain (Generalized). Negative for diarrhea, nausea and vomiting.  Genitourinary: Negative for dysuria, frequency, hematuria, vaginal bleeding, vaginal discharge and vaginal pain.  Musculoskeletal: Positive for back pain.  All other systems reviewed and are  negative.    Physical Exam Updated Vital Signs BP 102/66 (BP Location: Right Arm)   Pulse 93   Temp 98.1 F (36.7 C) (Oral)   Resp 18   Ht 5\' 1"  (1.549 m)   Wt 98 lb (44.5 kg)   LMP 10/19/2015   SpO2 99%   BMI 18.52 kg/m   Physical Exam  Constitutional: She appears well-developed and well-nourished. No distress.  HENT:  Head: Normocephalic and atraumatic.  Mouth/Throat: Oropharynx is clear and moist.  Eyes: Conjunctivae are normal. Right eye exhibits no discharge. Left eye exhibits no discharge. No scleral icterus.  Neck: Normal range of motion. Neck supple. No thyromegaly present.  Cardiovascular: Normal rate, regular rhythm, normal heart sounds and intact distal pulses.  Exam reveals no gallop and no friction rub.   No murmur heard. Pulmonary/Chest: Effort normal and breath sounds normal. No respiratory distress. She has no wheezes.  Abdominal: Soft. Bowel sounds are normal. She exhibits no distension. There is generalized tenderness. There is no rigidity, no rebound, no guarding, no CVA tenderness, no tenderness at McBurney's point and negative Murphy's sign.  Negative rosving sign, negative heel jar sign. Uterus fundus palpated halfway between mon pubis and navel.   Genitourinary: Vagina normal. There is no rash or tenderness on the right labia. There is no rash, tenderness or lesion on the left labia. Cervix exhibits no motion tenderness and no discharge. Right adnexum displays no mass, no tenderness and no fullness. Left adnexum displays no mass, no tenderness and no fullness. No erythema, tenderness or bleeding in the vagina. No vaginal discharge found.  Genitourinary Comments: Chaperone present for exam.   Musculoskeletal: Normal range of motion.  Mild ttp to paraspinal muscular on l spine. She has no CVA tenderness.  Lymphadenopathy:    She has no cervical adenopathy.  Neurological: She is alert.  Skin: Skin is warm and dry. Capillary refill takes less than 2 seconds.   Psychiatric: She has a normal mood and affect.  Nursing note and vitals reviewed.    ED Treatments / Results  DIAGNOSTIC STUDIES: Oxygen Saturation is 99% on RA, normal by my interpretation.    COORDINATION OF CARE: 10:01 PM- Pt advised of plan for treatment and pt agrees.  Labs (all labs ordered are listed, but only abnormal results are displayed) Labs Reviewed  WET PREP, GENITAL - Abnormal; Notable for the following:       Result Value   Yeast Wet Prep HPF POC PRESENT (*)    Clue Cells Wet Prep HPF POC PRESENT (*)    WBC, Wet Prep HPF POC FEW (*)    All other components within normal limits  URINALYSIS, ROUTINE W REFLEX MICROSCOPIC (NOT AT [facility]) - Abnormal; Notable for the following:    APPearance CLOUDY (*)    Glucose, UA 100 (*)    Ketones, ur 15 (*)    Leukocytes, UA MODERATE (*)  All other components within normal limits  URINE MICROSCOPIC-ADD ON - Abnormal; Notable for the following:    Squamous Epithelial / LPF 6-30 (*)    Bacteria, UA FEW (*)    All other components within normal limits  CBC WITH DIFFERENTIAL/PLATELET - Abnormal; Notable for the following:    RBC 3.69 (*)    Hemoglobin 11.3 (*)    HCT 33.1 (*)    All other components within normal limits  BASIC METABOLIC PANEL - Abnormal; Notable for the following:    Potassium 3.4 (*)    Calcium 8.8 (*)    All other components within normal limits  URINE CULTURE    EKG  EKG Interpretation None       Radiology US Ob Limited  Result Date: 01/16/2016 CLINICAL DATA:  25 year old pregnant female presenting with lower back pain. EXAM: LIMITED OBSTETRIC ULTRASOUND FINDINGS: Number of Fetuses:  Single Heart Rate:  150 bpm Movement:  Seen Presentation: Variable Placental Location: Anterior Previa: None Amniotic Fluid (Subjective):  Within normal limits. BPD:  4.3cm 18w  6d MATERNAL FINDINGS: Cervix:  Vein Appears closed.  The cervical length is 3.1 cm. Uterus/Adnexae:  Not visualized. IMPRESSION:  Unremarkable limited obstetrical ultrasound. Single live intrauterine pregnancy with an estimated gestational age of [redacted] weeks, 6 days based on today's biparietal diameter. This exam is performed on an emergent basis and does not comprehensively evaluate fetal size, dating, or anatomy; follow-up complete OB US should be considered if further fetal assessment is warranted. Electronically Signed   By: Elgie Collard M.D.   On: 01/16/2016 23:02    Procedures Procedures (including critical care time)  Medications Ordered in ED Medications  acetaminophen (TYLENOL) tablet 500 mg (500 mg Oral Given 01/16/16 2350)     Initial Impression / Assessment and Plan / ED Course  I have reviewed the triage vital signs and the nursing notes.  Pertinent labs & imaging results that were available during my care of the patient were reviewed by me and considered in my medical decision making (see chart for details).  Clinical Course  Patient presented to the ED with generalized abd cramping and low back pain. All labs were unremarkable. Stable hemoglobin. UA with moderate amount of leukocytes, few bacteria, small amount of ketones, glucose. Patient without any urinary symptoms. UA sent for culture. Patient started on Keflex as recommendation by Dr. Ranae Palms. Patient with history of ketones in urine. Glucose was normal NAD. Low suspicion for DKA. Patient without leukocytosis. Patient with generalized abdominal cramping. OB ultrasound without any acute abnormalities. Pelvic exam was unremarkable. Wet prep showed yeast and clue cells. We'll not treat for yeast infection given Diflucan with category C for pregnancy. I encouraged patient to follow-up with her OB/GYN on Monday. She verbalized understanding of this. Patient without peritoneal signs. Patient is afebrile and not tachycardic. Without leukocytosis. Low suspicion for appendicitis at this time. Likley round ligamnt pain. Patient encouraged to use tylenol at home.  Patient given strict return precautions and follow up with ob/gyn Monday. She verbalized understanding with plan of care. Dr. Ranae Palms saw and evaluated patient and agrees with the above plan. She is hemodynamically stable and able to ambulate in ED. Discharged home in no acute distress with stable vital signs. Final Clinical Impressions(s) / ED Diagnoses   Final diagnoses:  Low back pain without sciatica, unspecified back pain laterality, unspecified chronicity  Generalized abdominal cramping    New Prescriptions Discharge Medication List as of 01/17/2016 12:41 AM    START taking  these medications   Details  cephALEXin (KEFLEX) 500 MG capsule Take 1 capsule (500 mg total) by mouth 2 (two) times daily., Starting Sat 01/17/2016, Print       I personally performed the services described in this documentation, which was scribed in my presence. The recorded information has been reviewed and is accurate.     Rise Mu, PA-C 01/17/16 0100    Loren Racer, MD 01/23/16 [phone]

## 2016-01-17 LAB — CBC WITH DIFFERENTIAL/PLATELET
Basophils Absolute: 0 K/uL (ref 0.0–0.1)
Basophils Relative: 0 %
Eosinophils Absolute: 0.2 K/uL (ref 0.0–0.7)
Eosinophils Relative: 2 %
HCT: 33.1 % — ABNORMAL LOW (ref 36.0–46.0)
Hemoglobin: 11.3 g/dL — ABNORMAL LOW (ref 12.0–15.0)
Lymphocytes Relative: 17 %
Lymphs Abs: 1.6 K/uL (ref 0.7–4.0)
MCH: 30.6 pg (ref 26.0–34.0)
MCHC: 34.1 g/dL (ref 30.0–36.0)
MCV: 89.7 fL (ref 78.0–100.0)
Monocytes Absolute: 0.7 K/uL (ref 0.1–1.0)
Monocytes Relative: 7 %
Neutro Abs: 7.2 K/uL (ref 1.7–7.7)
Neutrophils Relative %: 74 %
Platelets: 180 K/uL (ref 150–400)
RBC: 3.69 MIL/uL — ABNORMAL LOW (ref 3.87–5.11)
RDW: 13.3 % (ref 11.5–15.5)
WBC: 9.6 K/uL (ref 4.0–10.5)

## 2016-01-17 LAB — BASIC METABOLIC PANEL
Anion gap: 7 (ref 5–15)
BUN: 12 mg/dL (ref 6–20)
CHLORIDE: 105 mmol/L (ref 101–111)
CO2: 23 mmol/L (ref 22–32)
CREATININE: 0.51 mg/dL (ref 0.44–1.00)
Calcium: 8.8 mg/dL — ABNORMAL LOW (ref 8.9–10.3)
GFR calc Af Amer: >60 mL/min (ref >60)
GFR calc non Af Amer: >60 mL/min (ref >60)
GLUCOSE: 82 mg/dL (ref 65–99)
Potassium: 3.4 mmol/L — ABNORMAL LOW (ref 3.5–5.1)
Sodium: 135 mmol/L (ref 135–145)

## 2016-01-17 MED ORDER — CEPHALEXIN 500 MG PO CAPS: 500.0000 mg | ORAL_CAPSULE | Freq: Two times a day (BID) | ORAL | 0 refills | Status: DC

## 2016-01-17 NOTE — Discharge Instructions (Signed)
Your ultra sound was normal. All of your labs have been normal. Your urine with possible signs of infection. Take antibiotics as prescribed. Follow up with your OB/GYN on Monday. Return to ED if your abdominal pain worsens, develops vaginal bleeding, vaginal discharge for the reason.

## 2016-01-17 NOTE — ED Notes (Signed)
Patient is alert and oriented x3.  He was given DC instructions and follow up visit instructions.  Patient gave verbal understanding.  He was DC ambulatory under his own power to home.  V/S stable.  He was not showing any signs of distress on DC 

## 2016-01-19 LAB — URINE CULTURE

## 2016-03-02 ENCOUNTER — Emergency Department (HOSPITAL_BASED_OUTPATIENT_CLINIC_OR_DEPARTMENT_OTHER)
Admission: EM | Admit: 2016-03-02 | Discharge: 2016-03-02 | Discharge status: Against Medical Advice | Payer: Medicaid Other | Attending: Dermatology | Admitting: Dermatology

## 2016-03-02 ENCOUNTER — Encounter (HOSPITAL_BASED_OUTPATIENT_CLINIC_OR_DEPARTMENT_OTHER): Payer: Self-pay | Admitting: *Deleted

## 2016-03-02 DIAGNOSIS — Z3A24 24 weeks gestation of pregnancy: Secondary | ICD-10-CM | POA: Diagnosis not present

## 2016-03-02 DIAGNOSIS — F17210 Nicotine dependence, cigarettes, uncomplicated: Secondary | ICD-10-CM | POA: Insufficient documentation

## 2016-03-02 DIAGNOSIS — R109 Unspecified abdominal pain: Secondary | ICD-10-CM | POA: Diagnosis not present

## 2016-03-02 DIAGNOSIS — O26892 Other specified pregnancy related conditions, second trimester: Secondary | ICD-10-CM | POA: Diagnosis present

## 2016-03-02 DIAGNOSIS — Z79899 Other long term (current) drug therapy: Secondary | ICD-10-CM | POA: Diagnosis not present

## 2016-03-02 DIAGNOSIS — Z5321 Procedure and treatment not carried out due to patient leaving prior to being seen by health care provider: Secondary | ICD-10-CM | POA: Diagnosis not present

## 2016-03-02 LAB — URINALYSIS, ROUTINE W REFLEX MICROSCOPIC
Bilirubin Urine: NEGATIVE
Glucose, UA: NEGATIVE mg/dL
Hgb urine dipstick: NEGATIVE
Ketones, ur: NEGATIVE mg/dL
NITRITE: NEGATIVE
PH: 7.5 (ref 5.0–8.0)
Protein, ur: NEGATIVE mg/dL
SPECIFIC GRAVITY, URINE: 1.017 (ref 1.005–1.030)

## 2016-03-02 LAB — URINALYSIS, MICROSCOPIC (REFLEX)

## 2016-03-02 NOTE — ED Notes (Signed)
No answer

## 2016-03-02 NOTE — ED Triage Notes (Signed)
Abdominal pain into her back. Headache. Decreased fetal movement for 2 days. She is [redacted] weeks pregnant.

## 2016-03-04 LAB — URINE CULTURE: CULTURE: NO GROWTH

## 2016-06-01 ENCOUNTER — Encounter (HOSPITAL_BASED_OUTPATIENT_CLINIC_OR_DEPARTMENT_OTHER): Payer: Self-pay | Admitting: Emergency Medicine

## 2016-06-01 ENCOUNTER — Emergency Department (HOSPITAL_BASED_OUTPATIENT_CLINIC_OR_DEPARTMENT_OTHER)
Admission: EM | Admit: 2016-06-01 | Discharge: 2016-06-02 | Disposition: A | Discharge status: Home / Self Care | Payer: Medicaid Other | Attending: Emergency Medicine | Admitting: Emergency Medicine

## 2016-06-01 DIAGNOSIS — O479 False labor, unspecified: Secondary | ICD-10-CM

## 2016-06-01 DIAGNOSIS — O99513 Diseases of the respiratory system complicating pregnancy, third trimester: Secondary | ICD-10-CM | POA: Diagnosis present

## 2016-06-01 DIAGNOSIS — Z3A37 37 weeks gestation of pregnancy: Secondary | ICD-10-CM | POA: Diagnosis not present

## 2016-06-01 DIAGNOSIS — F17210 Nicotine dependence, cigarettes, uncomplicated: Secondary | ICD-10-CM | POA: Diagnosis not present

## 2016-06-01 DIAGNOSIS — O2313 Infections of bladder in pregnancy, third trimester: Secondary | ICD-10-CM | POA: Insufficient documentation

## 2016-06-01 DIAGNOSIS — O99333 Smoking (tobacco) complicating pregnancy, third trimester: Secondary | ICD-10-CM | POA: Diagnosis not present

## 2016-06-01 DIAGNOSIS — J069 Acute upper respiratory infection, unspecified: Secondary | ICD-10-CM | POA: Diagnosis not present

## 2016-06-01 DIAGNOSIS — N3000 Acute cystitis without hematuria: Secondary | ICD-10-CM

## 2016-06-01 DIAGNOSIS — B9789 Other viral agents as the cause of diseases classified elsewhere: Secondary | ICD-10-CM

## 2016-06-01 NOTE — ED Triage Notes (Addendum)
Pt c/o cough and sore throat x 3 days. Pt is [redacted] weeks pregnant

## 2016-06-02 LAB — URINALYSIS, ROUTINE W REFLEX MICROSCOPIC
BILIRUBIN URINE: NEGATIVE
GLUCOSE, UA: NEGATIVE mg/dL
Hgb urine dipstick: NEGATIVE
Ketones, ur: NEGATIVE mg/dL
NITRITE: NEGATIVE
PH: 8 (ref 5.0–8.0)
Protein, ur: NEGATIVE mg/dL
SPECIFIC GRAVITY, URINE: 1.018 (ref 1.005–1.030)

## 2016-06-02 LAB — URINALYSIS, MICROSCOPIC (REFLEX): RBC / HPF: NONE SEEN RBC/hpf (ref 0–5)

## 2016-06-02 MED ORDER — CEPHALEXIN 500 MG PO CAPS: 500.0000 mg | ORAL_CAPSULE | Freq: Two times a day (BID) | ORAL | 0 refills | Status: AC

## 2016-06-02 MED ORDER — ACETAMINOPHEN 500 MG PO TABS
1000.0000 mg | ORAL_TABLET | Freq: Once | ORAL | Status: AC
  Administered 2016-06-02: 1000 mg via ORAL
  Filled 2016-06-02: qty 2

## 2016-06-02 MED ORDER — CEPHALEXIN 250 MG PO CAPS
500.0000 mg | ORAL_CAPSULE | Freq: Once | ORAL | Status: AC
  Administered 2016-06-02: 500 mg via ORAL
  Filled 2016-06-02: qty 2

## 2016-06-02 MED ORDER — SODIUM CHLORIDE 0.9 % IV BOLUS (SEPSIS)
1000.0000 mL | Freq: Once | INTRAVENOUS | Status: AC
  Administered 2016-06-02: 1000 mL via INTRAVENOUS

## 2016-06-02 NOTE — ED Provider Notes (Signed)
TIME SEEN: 12:15 AM  CHIEF COMPLAINT: URI  HPI: Pt is a 26 y.o. G2P1 who is 37 weeks and 4 days (due March 24th) followed by [facility] physicians in [facility] who presents emergency department today with complaints of 3 days of sore throat, cough with yellow sputum production. Denies fevers, chills. No nausea, vomiting or diarrhea. States now while in the waiting room she feels that she is having contractions. She also feels like over the past 4 days she has been "losing my mucus plugged". Denies any vaginal bleeding, leaking fluid. States she has not felt her baby move today. No current abdominal pain.  ROS: See HPI Constitutional: no fever  Eyes: no drainage  ENT: no runny nose   Cardiovascular:  no chest pain  Resp: no SOB  GI: no vomiting GU: no dysuria Integumentary: no rash  Allergy: no hives  Musculoskeletal: no leg swelling  Neurological: no slurred speech ROS otherwise negative  PAST MEDICAL HISTORY/PAST SURGICAL HISTORY:  Past Medical History:  Diagnosis Date  . Anxiety   . Colitis     MEDICATIONS:  Prior to Admission medications   Medication Sig Start Date End Date Taking? Authorizing Provider  cephALEXin (KEFLEX) 500 MG capsule Take 1 capsule (500 mg total) by mouth 2 (two) times daily. 01/17/16   Rise Mu, PA-C  ciprofloxacin (CIPRO) 500 MG tablet Take 1 tablet (500 mg total) by mouth 2 (two) times daily. 07/12/15   Belkys A Regalado, MD  HYDROcodone-acetaminophen (NORCO/VICODIN) 5-325 MG per tablet Take 2 tablets by mouth every 4 (four) hours as needed. Patient not taking: Reported on 07/08/2015 09/26/13   Elson Areas, PA-C  LORazepam (ATIVAN) 1 MG tablet Take 1 tablet (1 mg total) by mouth every 6 (six) hours as needed for anxiety. Patient not taking: Reported on 07/08/2015 04/22/15   Geoffery Lyons, MD  mesalamine (LIALDA) 1.2 g EC tablet Take 1.2 g by mouth 3 (three) times daily.     Historical Provider, MD  metoCLOPramide (REGLAN) 10 MG tablet Take 1 tablet (10  mg total) by mouth every 8 (eight) hours as needed for nausea or vomiting. 11/01/15   Loren Racer, MD  metroNIDAZOLE (FLAGYL) 500 MG tablet Take 1 tablet (500 mg total) by mouth every 8 (eight) hours. 07/12/15   Belkys A Regalado, MD  pantoprazole (PROTONIX) 40 MG tablet Take 1 tablet (40 mg total) by mouth daily. 07/12/15   Belkys A Regalado, MD  predniSONE (DELTASONE) 20 MG tablet Take 40 mg daily 07/12/15   Belkys A Regalado, MD    ALLERGIES:  No Known Allergies  SOCIAL HISTORY:  Social History  Substance Use Topics  . Smoking status: Current Every Day Smoker    Packs/day: 0.50    Types: Cigarettes  . Smokeless tobacco: Never Used  . Alcohol use No    FAMILY HISTORY: Family History  Problem Relation Age of Onset  . Heart attack Father     EXAM: BP 128/82 (BP Location: Left Arm)   Pulse 105   Temp 98.3 F (36.8 C) (Oral)   Resp 18   Wt 125 lb (56.7 kg)   LMP 10/19/2015   SpO2 99%   BMI 24.41 kg/m  CONSTITUTIONAL: Alert and oriented and responds appropriately to questions. Well-appearing; well-nourished HEAD: Normocephalic EYES: Conjunctivae clear, PERRL, EOMI ENT: normal nose; no rhinorrhea; moist mucous membranes; No pharyngeal erythema or petechiae, no tonsillar hypertrophy or exudate, no uvular deviation, no unilateral swelling, no trismus or drooling, no muffled voice, normal phonation,  no stridor, no dental caries present, no drainable dental abscess noted, no Ludwig's angina, tongue sits flat in the bottom of the mouth, no angioedema, no facial erythema or warmth, no facial swelling; no pain with movement of the neck NECK: Supple, no meningismus, no nuchal rigidity, no LAD  CARD: RRR; S1 and S2 appreciated; no murmurs, no clicks, no rubs, no gallops RESP: Normal chest excursion without splinting or tachypnea; breath sounds clear and equal bilaterally; no wheezes, no rhonchi, no rales, no hypoxia or respiratory distress, speaking full sentences ABD/GI: Normal bowel  sounds; non-distended; soft, non-tender, no rebound, no guarding, no peritoneal signs, no hepatosplenomegaly, gravid uterus GU:  2-3 cm dilated, -2 station, 25 % effaced, wite vaginal discharge, no leaking fluid, no vaginal bleeding BACK:  The back appears normal and is non-tender to palpation, there is no CVA tenderness EXT: Normal ROM in all joints; non-tender to palpation; no edema; normal capillary refill; no cyanosis, no calf tenderness or swelling    SKIN: Normal color for age and race; warm; no rash NEURO: Moves all extremities equally PSYCH: The patient's mood and manner are appropriate. Grooming and personal hygiene are appropriate.  MEDICAL DECISION MAKING: Patient here with complaints of URI symptoms. Doubt pneumonia given no fever and clear lungs. Doubt strep pharyngitis, deep space neck infection, peritonsillar abscess, meningitis based on her very benign exam. I do not feel she needs antibiotics at this time. I feel strep test and chest x-ray would be normal today and we have discussed this and she agrees with holding on these tests at this time. Also doubt influenza given no fever but even if this was the flu she would be outside treatment window for Tamiflu at this point.  Patient now complaining of feeling contractions since arriving in the emergency department. States she has not felt her baby move all day but has had normal fetal heart tones here. Abdomen is soft and nontender.  We have hooked her up to continuous tocometry and will consult rapid OB nurse.  Sterile exam performed and she is approximately 2-3 cm dilated but still at -2 station only approximately 25% effaced if even that much. I do not see any signs of vaginal bleeding or amniotic fluid on exam.  Will obtain urinalysis.  ED PROGRESS: Patient's tocometry revealed irregular contractions. OB has been consulted and they feel patient can be discharged with close follow-up with her OB/GYN tomorrow. She is comfortable with this  plan. She has received a liter of IV fluids here. Urine shows small leukocytes and many bacteria. Culture is pending. We'll start her on Keflex for possible UTI and pregnancy.  I feel she is safe to be discharged home with close follow-up as an outpatient. She is comfortable with this plan. Have advised her to increase her water intake at home. Discussed at length return precautions.   At this time, I do not feel there is any life-threatening condition present. I have reviewed and discussed all results (EKG, imaging, lab, urine as appropriate) and exam findings with patient/family. I have reviewed nursing notes and appropriate previous records.  I feel the patient is safe to be discharged home without further emergent workup and can continue workup as an outpatient as needed. Discussed usual and customary return precautions. Patient/family verbalize understanding and are comfortable with this plan.  Outpatient follow-up has been provided. All questions have been answered.      Amanda MawKristen N Neilan Rizzo, DO 06/02/16 [id number]

## 2016-06-02 NOTE — Discharge Instructions (Addendum)
Please increase your water intake at home. You may take Tylenol 1000 g every 6 hours as needed for fever and pain. You do not need antibiotics at this time. Your symptoms are likely caused by a virus. Please follow-up with your OB/GYN in the next 24-48 hours.

## 2016-06-02 NOTE — Progress Notes (Signed)
[facility] called about patient who presented to [facility] ED with complaints of cough and sore throat; who in ED patient also mentioned to nurse that she has been having discharge x4 days, decreased fetal movement, and contractions; patient is a G1P0 at 37 and 4/[redacted] weeks along in her pregnancy at this time; ED provider performed SVE and 2-3cm reported; EFM applied and reactive tracing noted; Dr Macon LargeAnyanwu notified of patient's complaint, VS, and EFM tracing; orders given for patient to be discharged and to follow up with her OB/GYN in the morning; ED RN notified of orders

## 2016-06-03 LAB — URINE CULTURE: Culture: NO GROWTH

## 2016-12-19 IMAGING — US US OB COMP LESS 14 WK
1 series · 14 of 28 positions shown · non-contrast
Comparison: None.

CLINICAL DATA: Pregnant patient in first-trimester pregnancy with
vaginal bleeding and pelvic cramping. Nausea.

EXAM:
OBSTETRIC <14 WK US AND TRANSVAGINAL OB US
TECHNIQUE: Both transabdominal and transvaginal ultrasound examinations were
performed for complete evaluation of the gestation as well as the
maternal uterus, adnexal regions, and pelvic cul-de-sac.
Transvaginal technique was performed to assess early pregnancy.

[Series 1: us ob comp less 14 wk · 0.12mm/px · 14 of 95 slices shown]
[im 4/95]
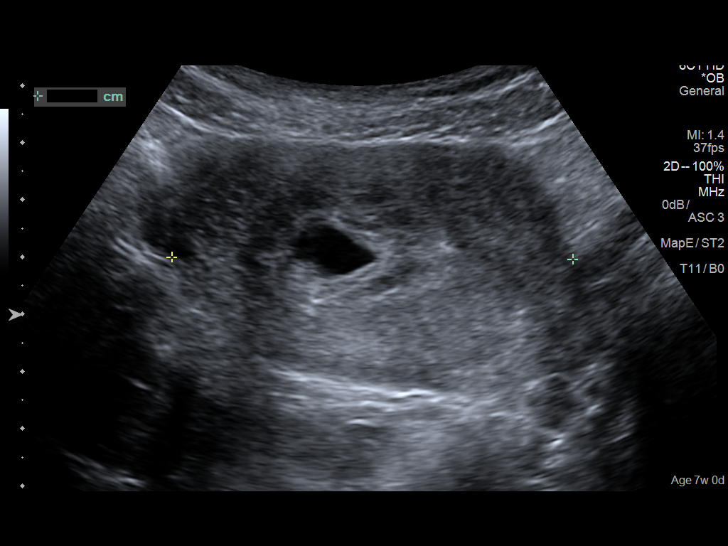
[im 11/95]
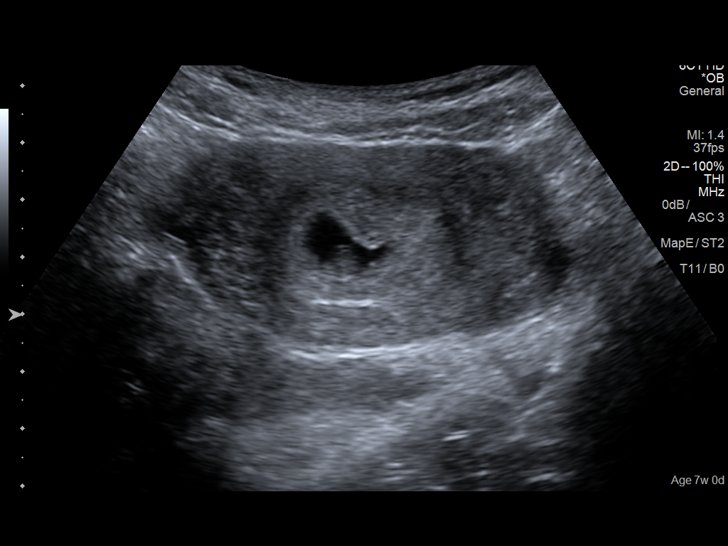
[im 18/95]
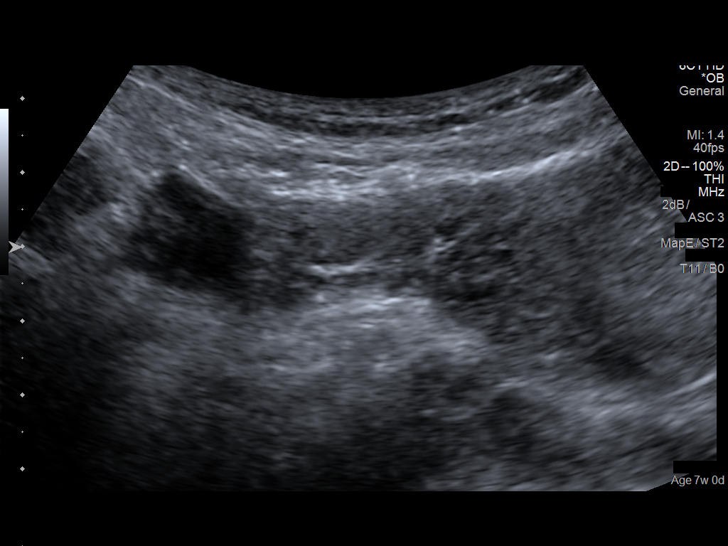
[im 25/95]
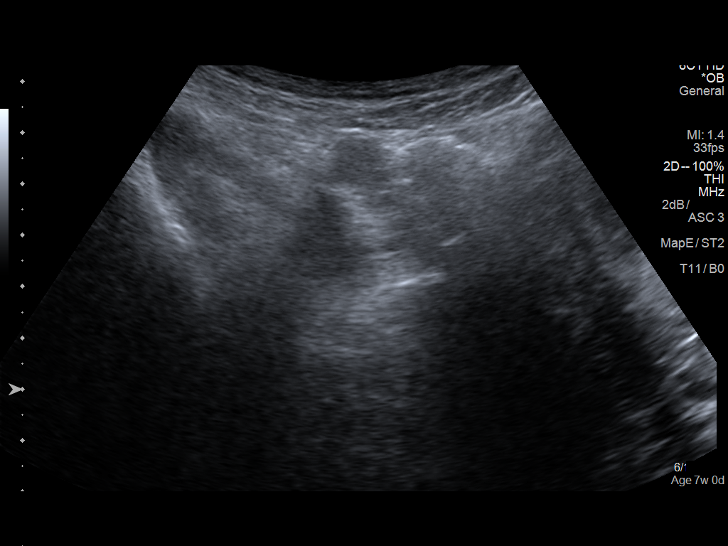
[im 32/95]
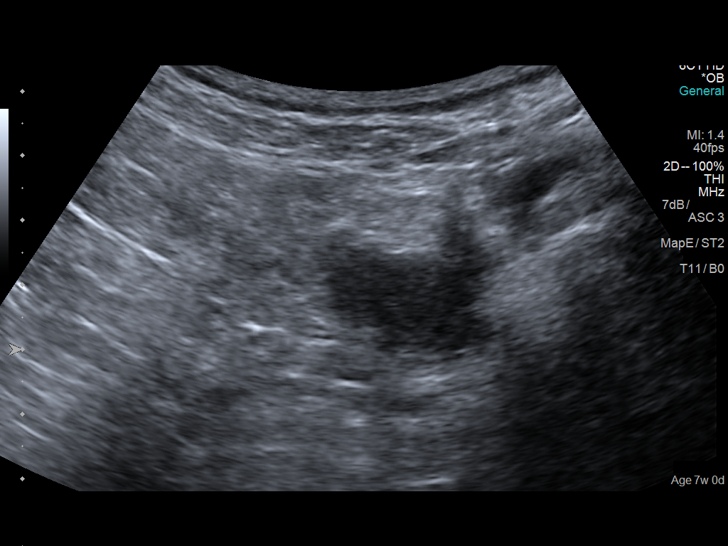
[im 39/95]
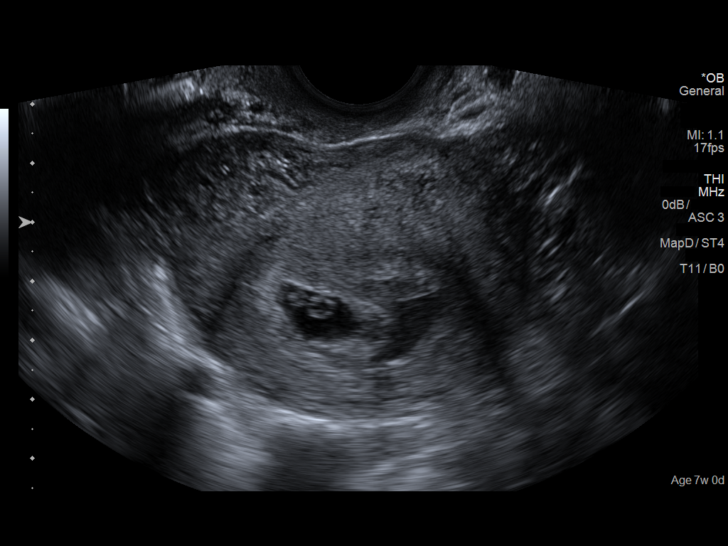
[im 46/95]
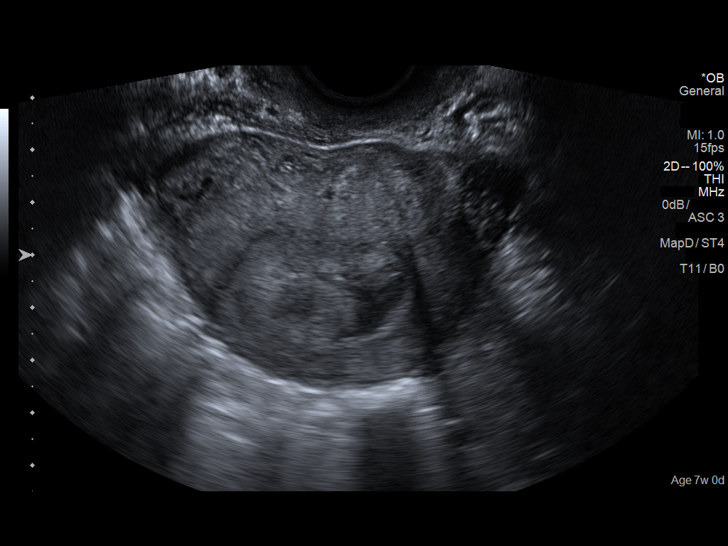
[im 53/95]
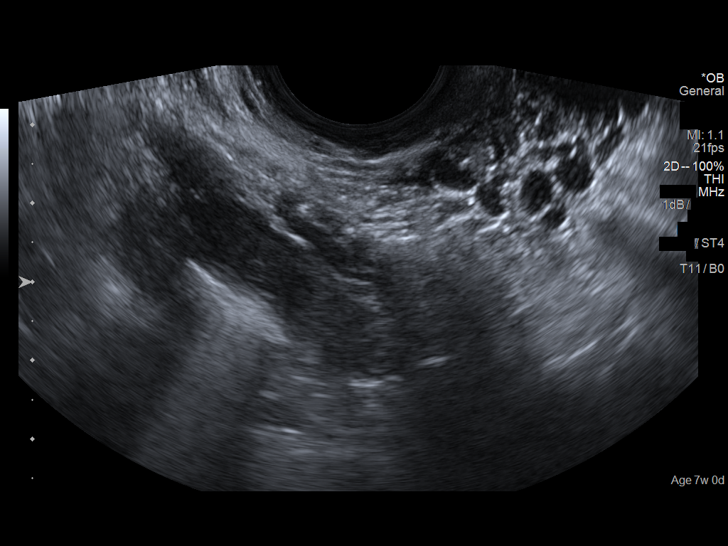
[im 60/95]
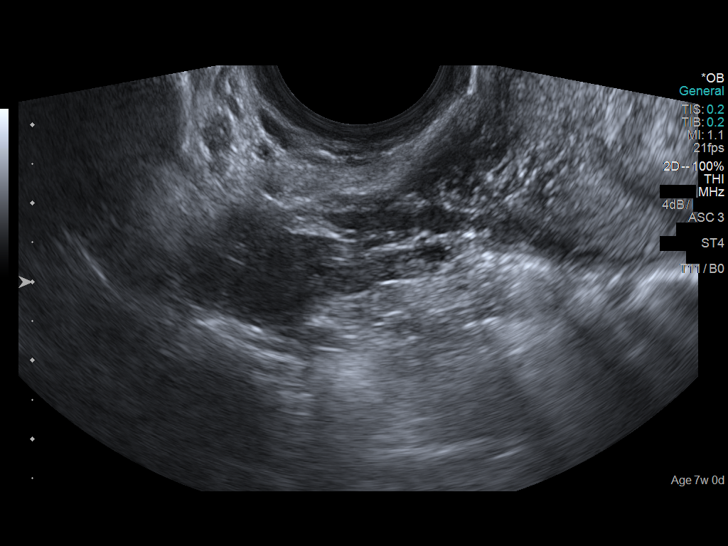
[im 67/95]
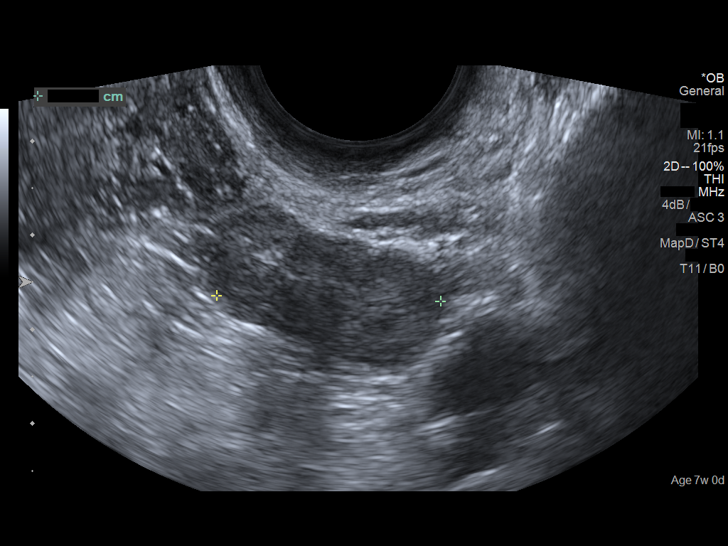
[im 74/95]
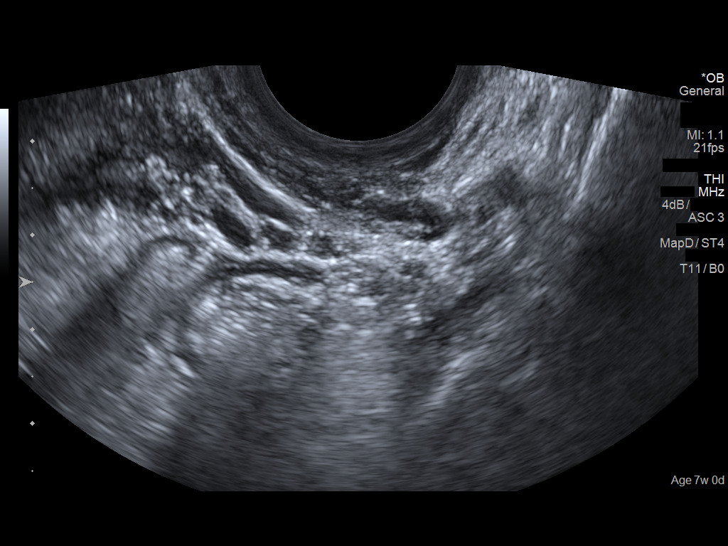
[im 81/95]
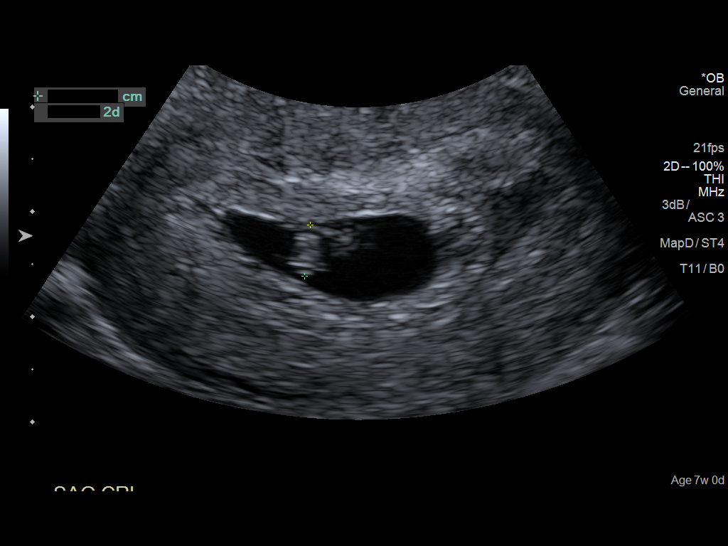
[im 88/95]
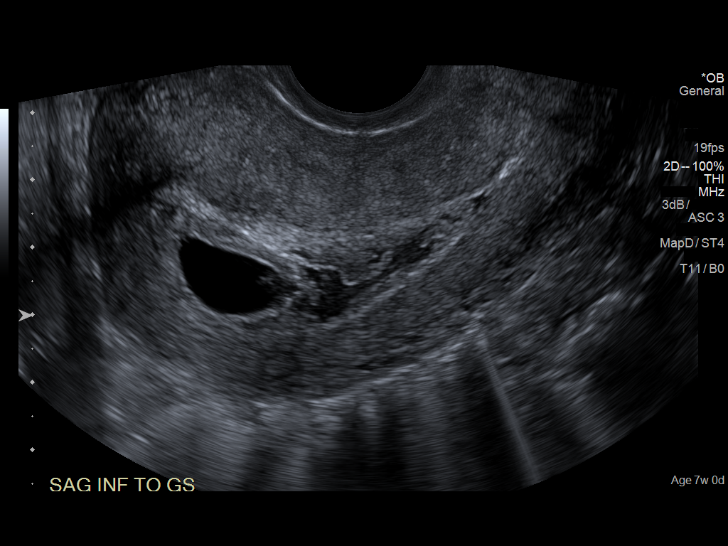
[im 95/95]
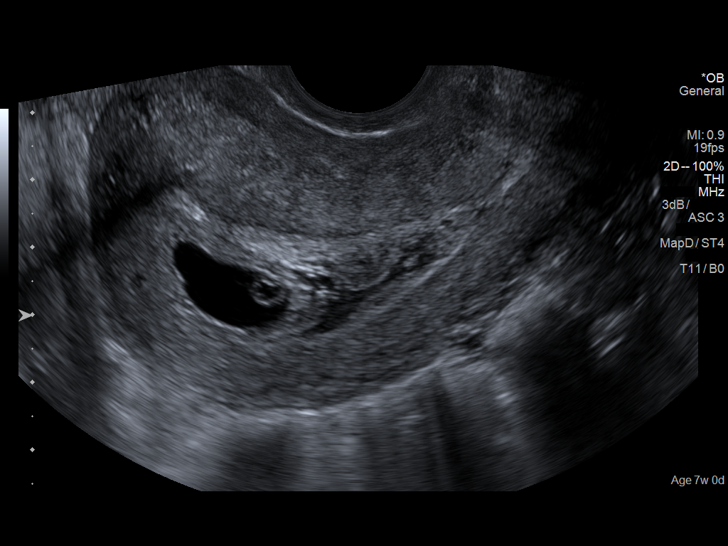

[14 of 28 positions shown; findings below may reference images not displayed]

FINDINGS: Intrauterine gestational sac: Single

Yolk sac:  Present

Embryo:  Present.

Cardiac Activity: Present.

Heart Rate: 124  bpm

CRL:  4.9  mm   6  w   2 d                  US EDC: 06/24/2016

Subchorionic hemorrhage: Moderate, lateral and inferior to the
gestational sac. Measurements difficult due to the curvilinear
nature.

Maternal uterus/adnexae: Both ovaries are normal. Probable corpus
luteal cyst on the left. No pelvic free fluid.
IMPRESSION: Single live intrauterine pregnancy estimated gestational age 6 weeks
2 days for estimated date of delivery 06/24/2016.

Moderate subchorionic hemorrhage.

## 2017-03-05 IMAGING — US US OB LIMITED
1 series · 14 of 28 positions shown · non-contrast
Comparison: none

CLINICAL DATA: 24-year-old pregnant female presenting with lower
back pain.

EXAM:
LIMITED OBSTETRIC ULTRASOUND

[Series 1: us ob limited · 0.20mm/px · 32 acquisitions, 14 frames shown]
[im 2/32]
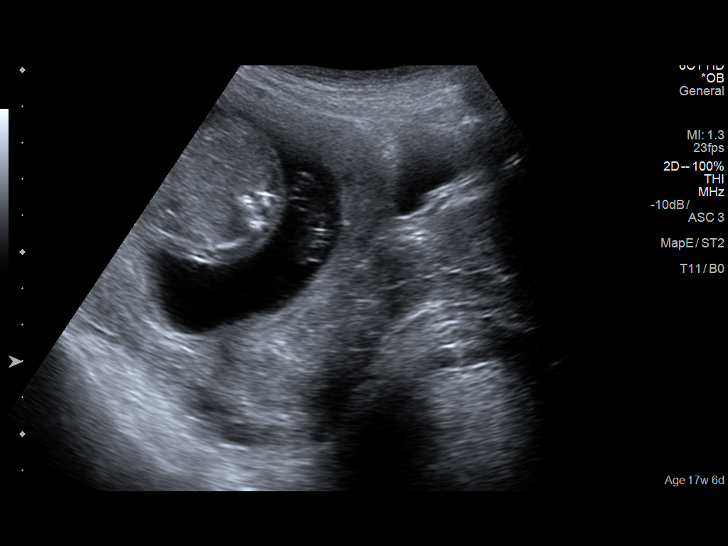
[im 4/32]
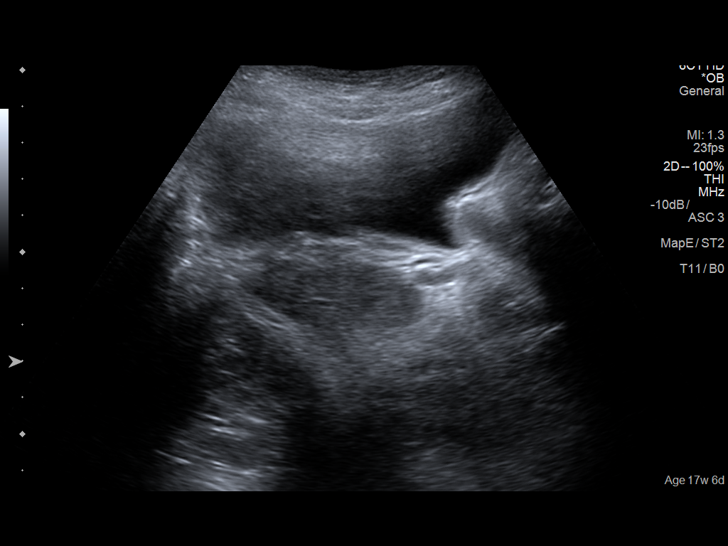
[im 6/32]
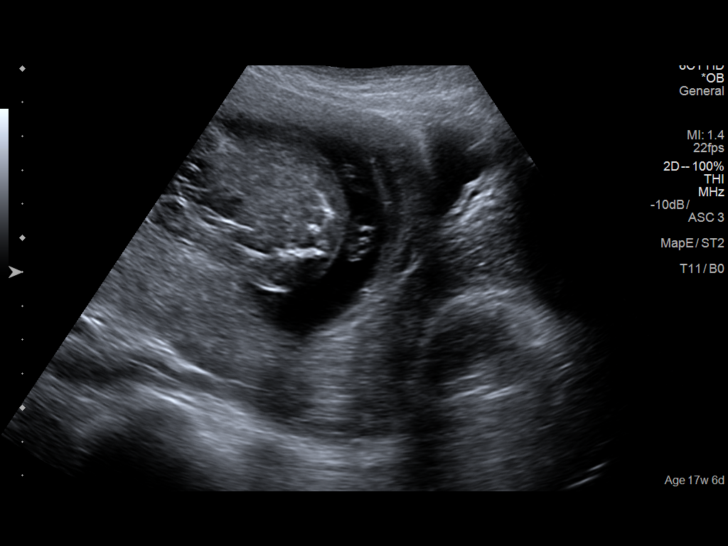
[im 9/32]
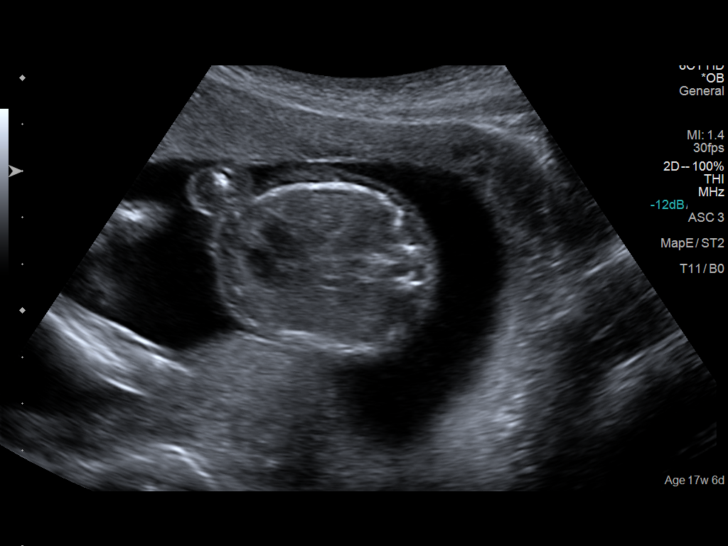
[im 11/32]
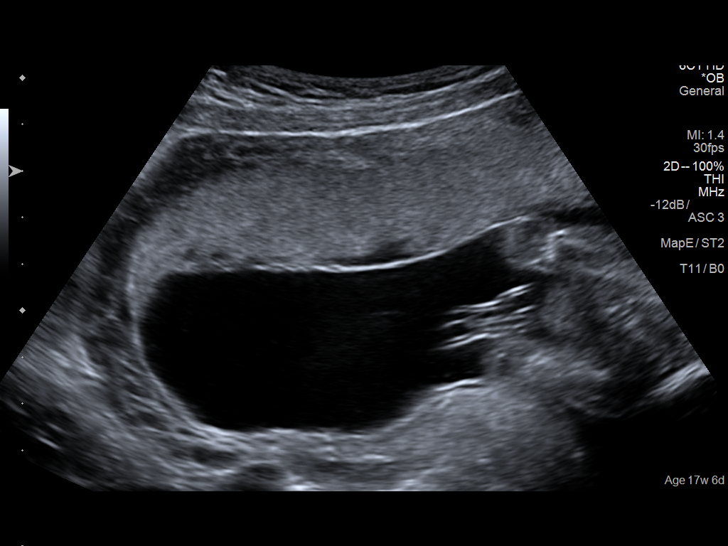
[im 13/32]
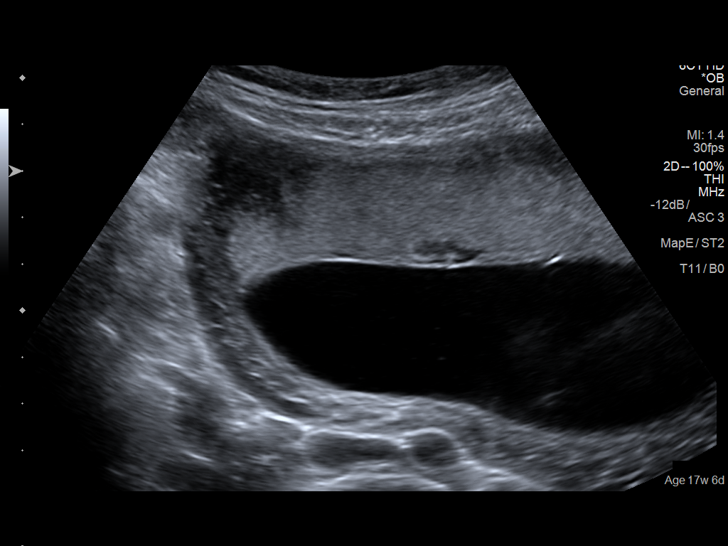
[im 15/32]
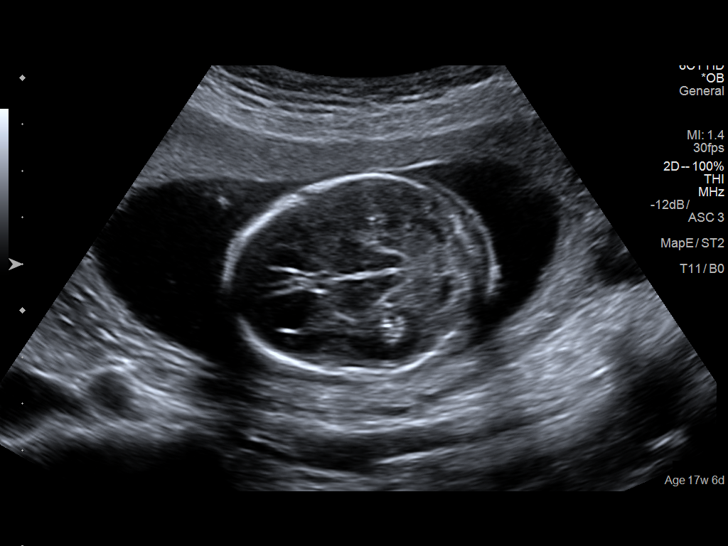
[im 18/32]
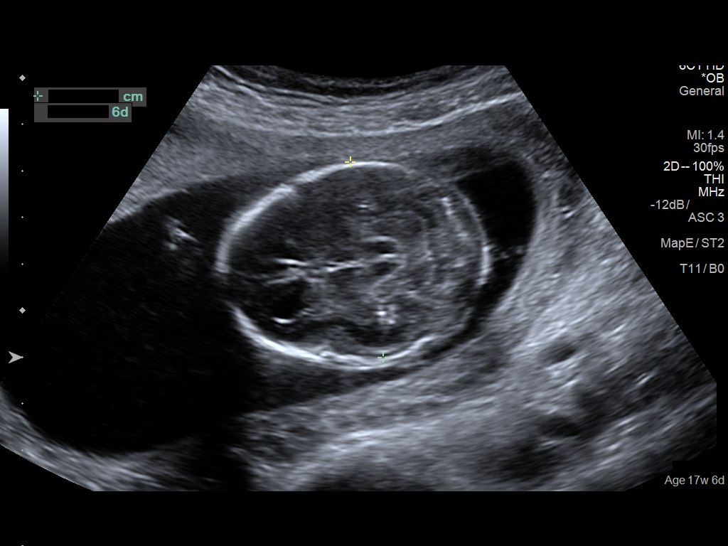
[im 20/32]
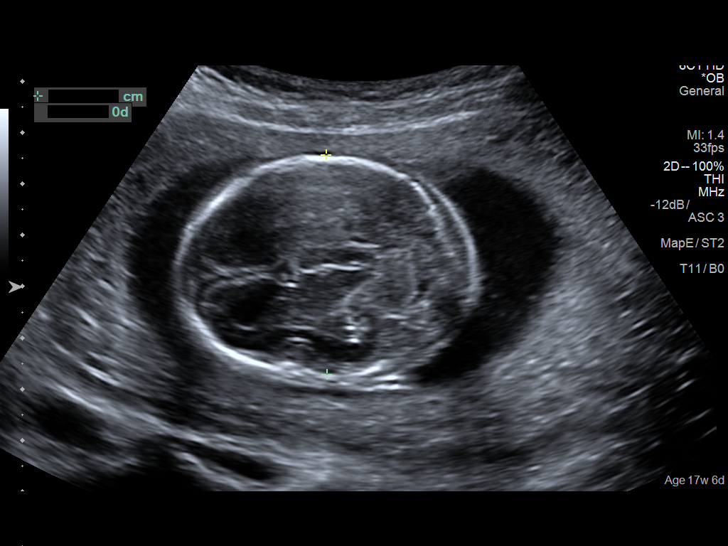
[im 22/32]
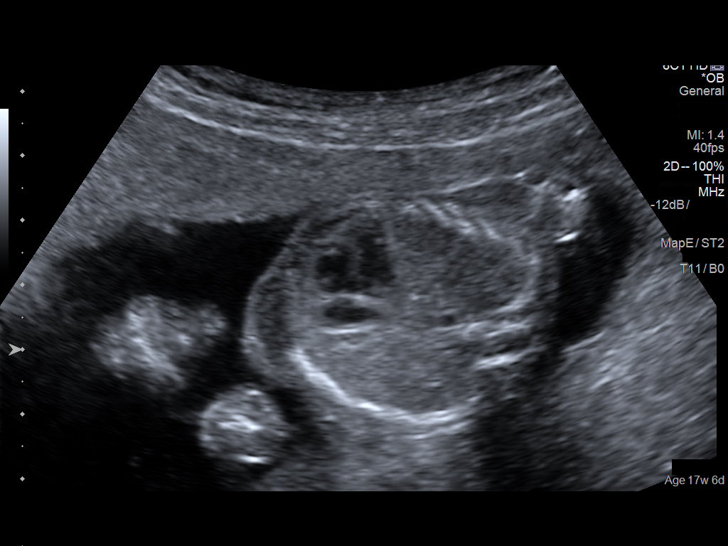
[im 25/32]
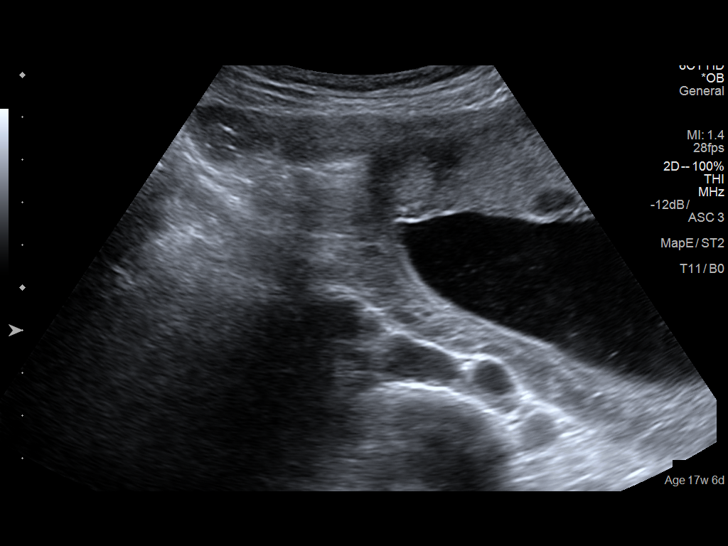
[im 27/32]
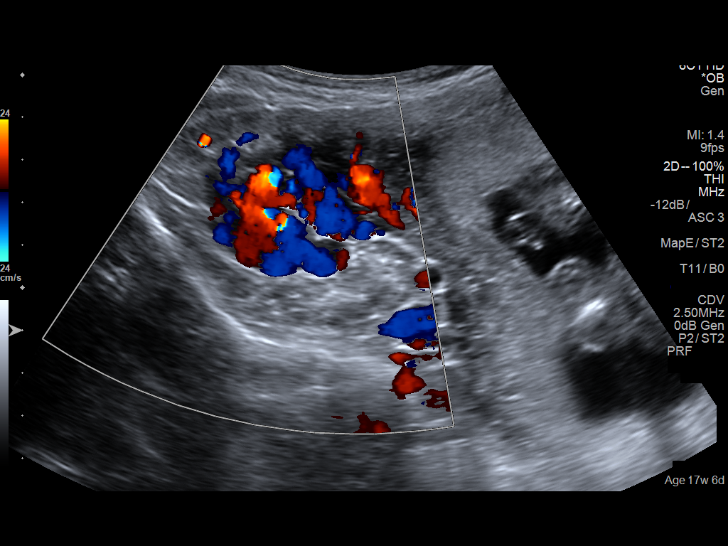
[im 29/32]
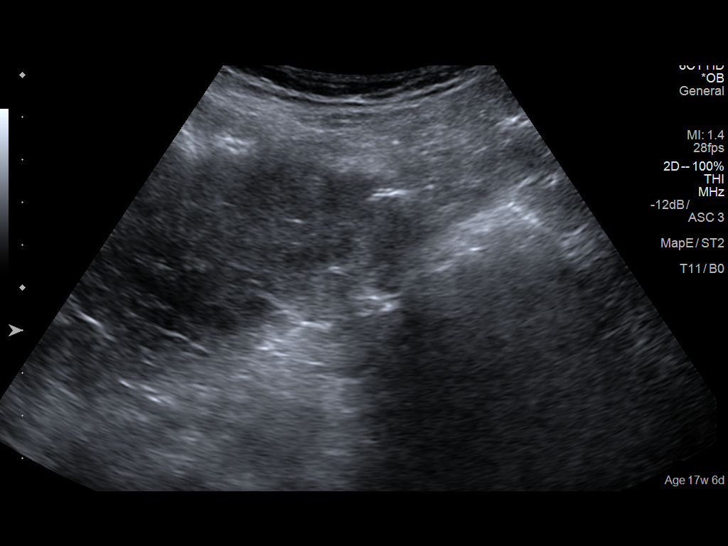
[im 32/32]
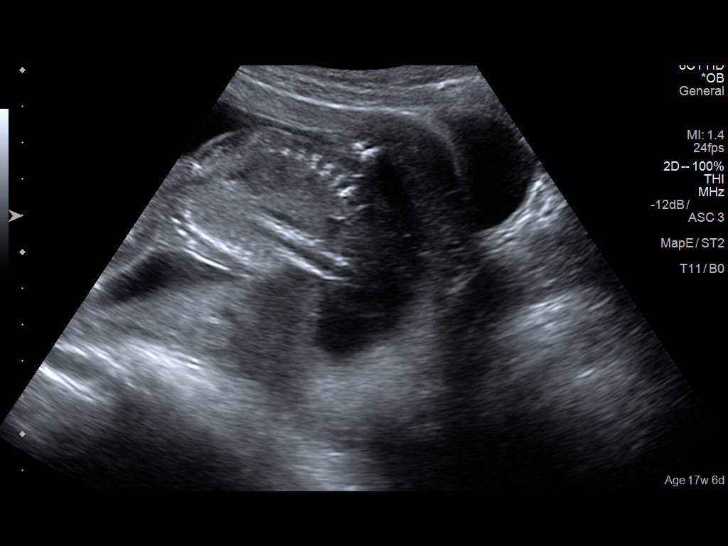

[14 of 28 positions shown; findings below may reference images not displayed]

FINDINGS: Number of Fetuses:  Single

Heart Rate:  150 bpm

Movement:  Seen

Presentation: Variable

Placental Location: Anterior

Previa: None

Amniotic Fluid (Subjective):  Within normal limits.

BPD:  4.3cm 18w  6d

MATERNAL FINDINGS:

Cervix:  Vein Appears closed.  The cervical length is 3.1 cm.

Uterus/Adnexae:  Not visualized.
IMPRESSION: Unremarkable limited obstetrical ultrasound. Single live
intrauterine pregnancy with an estimated gestational age of 18
weeks, 6 days based on today's biparietal diameter.

This exam is performed on an emergent basis and does not
comprehensively evaluate fetal size, dating, or anatomy; follow-up
complete OB US should be considered if further fetal assessment is
warranted.

## 2017-07-25 ENCOUNTER — Emergency Department (HOSPITAL_BASED_OUTPATIENT_CLINIC_OR_DEPARTMENT_OTHER)
Admission: EM | Admit: 2017-07-25 | Discharge: 2017-07-26 | Disposition: A | Discharge status: Home / Self Care | Payer: Medicaid Other | Attending: Emergency Medicine | Admitting: Emergency Medicine

## 2017-07-25 ENCOUNTER — Other Ambulatory Visit: Payer: Self-pay

## 2017-07-25 ENCOUNTER — Encounter (HOSPITAL_BASED_OUTPATIENT_CLINIC_OR_DEPARTMENT_OTHER): Payer: Self-pay

## 2017-07-25 DIAGNOSIS — R070 Pain in throat: Secondary | ICD-10-CM | POA: Diagnosis not present

## 2017-07-25 DIAGNOSIS — R112 Nausea with vomiting, unspecified: Secondary | ICD-10-CM | POA: Insufficient documentation

## 2017-07-25 DIAGNOSIS — H9390 Unspecified disorder of ear, unspecified ear: Secondary | ICD-10-CM | POA: Diagnosis not present

## 2017-07-25 DIAGNOSIS — F17210 Nicotine dependence, cigarettes, uncomplicated: Secondary | ICD-10-CM | POA: Insufficient documentation

## 2017-07-25 DIAGNOSIS — R197 Diarrhea, unspecified: Secondary | ICD-10-CM | POA: Insufficient documentation

## 2017-07-25 DIAGNOSIS — B349 Viral infection, unspecified: Secondary | ICD-10-CM

## 2017-07-25 DIAGNOSIS — Z79899 Other long term (current) drug therapy: Secondary | ICD-10-CM | POA: Diagnosis not present

## 2017-07-25 DIAGNOSIS — R05 Cough: Secondary | ICD-10-CM | POA: Diagnosis present

## 2017-07-25 LAB — RAPID STREP SCREEN (MED CTR MEBANE ONLY): STREPTOCOCCUS, GROUP A SCREEN (DIRECT): NEGATIVE

## 2017-07-25 LAB — PREGNANCY, URINE: PREG TEST UR: NEGATIVE

## 2017-07-25 MED ORDER — IBUPROFEN 800 MG PO TABS
800.0000 mg | ORAL_TABLET | Freq: Once | ORAL | Status: AC
  Administered 2017-07-25: 800 mg via ORAL
  Filled 2017-07-25: qty 1

## 2017-07-25 MED ORDER — ACETAMINOPHEN 500 MG PO TABS
1000.0000 mg | ORAL_TABLET | Freq: Once | ORAL | Status: AC
  Administered 2017-07-25: 1000 mg via ORAL
  Filled 2017-07-25: qty 2

## 2017-07-25 MED ORDER — ONDANSETRON 8 MG PO TBDP
8.0000 mg | ORAL_TABLET | Freq: Once | ORAL | Status: AC
  Administered 2017-07-25: 8 mg via ORAL
  Filled 2017-07-25: qty 1

## 2017-07-25 NOTE — ED Notes (Signed)
ED Provider at bedside. 

## 2017-07-25 NOTE — ED Notes (Signed)
Patient given sprite with oral meds.

## 2017-07-25 NOTE — ED Triage Notes (Signed)
C/o flu like sx day 4-NAD-steady gait 

## 2017-07-26 ENCOUNTER — Encounter (HOSPITAL_BASED_OUTPATIENT_CLINIC_OR_DEPARTMENT_OTHER): Payer: Self-pay | Admitting: Emergency Medicine

## 2017-07-26 MED ORDER — IBUPROFEN 800 MG PO TABS: 800.0000 mg | ORAL_TABLET | Freq: Three times a day (TID) | ORAL | 0 refills | Status: AC

## 2017-07-26 MED ORDER — ONDANSETRON 8 MG PO TBDP: ORAL_TABLET | ORAL | 0 refills | Status: AC

## 2017-07-26 NOTE — ED Provider Notes (Signed)
[facility] EMERGENCY DEPARTMENT Provider Note   CSN: [id number] Arrival date & time: 07/25/17  2140     History   Chief Complaint Chief Complaint  Patient presents with  . Cough    HPI Amanda Dominguez is a 27 y.o. female.  The history is provided by the patient.  Cough  This is a new problem. The current episode started more than 2 days ago. The problem occurs every few hours. The problem has not changed since onset.The cough is non-productive. There has been no fever. Associated symptoms include ear congestion and sore throat. Pertinent negatives include no chest pain, no chills, no sweats, no weight loss, no rhinorrhea, no shortness of breath and no wheezing. Associated symptoms comments: Also nausea and vomiting and diarrhea. She has tried nothing for the symptoms. The treatment provided no relief. She is a smoker. Her past medical history does not include COPD or asthma.    Past Medical History:  Diagnosis Date  . Anxiety   . Colitis     Patient Active Problem List   Diagnosis Date Noted  . SBO (small bowel obstruction) (HCC) 07/08/2015  . Hypokalemia 07/08/2015  . Ulcerative colitis (HCC) 07/08/2015    History reviewed. No pertinent surgical history.   OB History    Gravida  1   Para      Term      Preterm      AB      Living        SAB      TAB      Ectopic      Multiple      Live Births               Home Medications    Prior to Admission medications   Medication Sig Start Date End Date Taking? Authorizing Provider  cephALEXin (KEFLEX) 500 MG capsule Take 1 capsule (500 mg total) by mouth 2 (two) times daily. 06/02/16   Ward, Layla Maw, DO  ibuprofen (ADVIL,MOTRIN) 800 MG tablet Take 1 tablet (800 mg total) by mouth 3 (three) times daily. 07/26/17   Nakema Fake, MD  mesalamine (LIALDA) 1.2 g EC tablet Take 1.2 g by mouth 3 (three) times daily.     [provider]  ondansetron (ZOFRAN ODT) 8 MG disintegrating tablet   ODT q12 hours prn nausea 07/26/17   Django Nguyen, MD    Family History Family History  Problem Relation Age of Onset  . Heart attack Father     Social History Social History   Tobacco Use  . Smoking status: Current Every Day Smoker    Packs/day: 0.50    Types: Cigarettes  . Smokeless tobacco: Never Used  Substance Use Topics  . Alcohol use: No  . Drug use: No     Allergies   Patient has no known allergies.   Review of Systems Review of Systems  Constitutional: Negative for chills, fever and weight loss.  HENT: Positive for congestion and sore throat. Negative for dental problem, drooling, rhinorrhea, tinnitus, trouble swallowing and voice change.   Respiratory: Positive for cough. Negative for choking, shortness of breath and wheezing.   Cardiovascular: Negative for chest pain.  Gastrointestinal: Positive for diarrhea, nausea and vomiting. Negative for abdominal pain, anal bleeding, blood in stool, constipation and rectal pain.  Genitourinary: Negative for dysuria.  Musculoskeletal: Negative for neck pain and neck stiffness.  All other systems reviewed and are negative.    Physical Exam Updated Vital Signs  BP 107/78 (BP Location: Right Arm)   Pulse 79   Temp 98.3 F (36.8 C) (Oral)   Resp 16   Wt 47.1 kg (103 lb 13.4 oz)   LMP  (LMP Unknown)   SpO2 98%   Breastfeeding? Unknown   BMI 20.28 kg/m   Physical Exam  Constitutional: She is oriented to person, place, and time. She appears well-developed and well-nourished. No distress.  HENT:  Head: Normocephalic and atraumatic.  Right Ear: External ear normal.  Left Ear: External ear normal.  Nose: Nose normal.  Mouth/Throat: Oropharynx is clear and moist. No oropharyngeal exudate.  Eyes: Pupils are equal, round, and reactive to light. Conjunctivae are normal.  Neck: Normal range of motion. Neck supple. No tracheal deviation present.  Cardiovascular: Normal rate, regular rhythm, normal heart sounds and  intact distal pulses.  Pulmonary/Chest: Effort normal and breath sounds normal. No stridor. No respiratory distress. She has no wheezes. She has no rales.  Abdominal: Soft. Bowel sounds are normal. She exhibits no mass. There is no tenderness. There is no rebound and no guarding.  Musculoskeletal: Normal range of motion.  Lymphadenopathy:    She has no cervical adenopathy.  Neurological: She is alert and oriented to person, place, and time. She displays normal reflexes.  Skin: Skin is warm and dry. Capillary refill takes less than 2 seconds.  Psychiatric: She has a normal mood and affect.     ED Treatments / Results  Labs (all labs ordered are listed, but only abnormal results are displayed) Results for orders placed or performed during the hospital encounter of 07/25/17  Rapid Strep Screen ([facility] ONLY)  Result Value Ref Range   Streptococcus, Group A Screen (Direct) NEGATIVE NEGATIVE  Pregnancy, urine  Result Value Ref Range   Preg Test, Ur NEGATIVE NEGATIVE   No results found.  Procedures Procedures (including critical care time)  Medications Ordered in ED Medications  ibuprofen (ADVIL,MOTRIN) tablet 800 mg (800 mg Oral Given 07/25/17 2148)  ondansetron (ZOFRAN-ODT) disintegrating tablet 8 mg (8 mg Oral Given 07/25/17 2344)  acetaminophen (TYLENOL) tablet 1,000 mg (1,000 mg Oral Given 07/25/17 2344)       Final Clinical Impressions(s) / ED Diagnoses   Final diagnoses:  Viral illness  Nausea vomiting and diarrhea   Return for weakness, numbness, changes in vision or speech, fevers >100.4 unrelieved by medication, shortness of breath, intractable vomiting, or diarrhea, abdominal pain, Inability to tolerate liquids or food, cough, altered mental status or any concerns. No signs of systemic illness or infection. The patient is nontoxic-appearing on exam and vital signs are within normal limits.   I have reviewed the triage vital signs and the nursing notes. Pertinent  labs &imaging results that were available during my care of the patient were reviewed by me and considered in my medical decision making (see chart for details).  After history, exam, and medical workup I feel the patient has been appropriately medically screened and is safe for discharge home. Pertinent diagnoses were discussed with the patient. Patient was given return precautions. ED Discharge Orders        Ordered    ondansetron (ZOFRAN ODT) 8 MG disintegrating tablet     07/26/17 0009    ibuprofen (ADVIL,MOTRIN) 800 MG tablet  3 times daily     07/26/17 0009       Kaytlan Behrman, MD 07/26/17 1610

## 2017-07-28 LAB — CULTURE, GROUP A STREP (THRC)

## 2017-08-01 ENCOUNTER — Emergency Department (HOSPITAL_BASED_OUTPATIENT_CLINIC_OR_DEPARTMENT_OTHER)
Admission: EM | Admit: 2017-08-01 | Discharge: 2017-08-01 | Disposition: A | Discharge status: Home / Self Care | Payer: Medicaid Other | Attending: Emergency Medicine | Admitting: Emergency Medicine

## 2017-08-01 ENCOUNTER — Encounter (HOSPITAL_BASED_OUTPATIENT_CLINIC_OR_DEPARTMENT_OTHER): Payer: Self-pay | Admitting: *Deleted

## 2017-08-01 ENCOUNTER — Other Ambulatory Visit: Payer: Self-pay

## 2017-08-01 DIAGNOSIS — J039 Acute tonsillitis, unspecified: Secondary | ICD-10-CM | POA: Diagnosis not present

## 2017-08-01 DIAGNOSIS — F17210 Nicotine dependence, cigarettes, uncomplicated: Secondary | ICD-10-CM | POA: Insufficient documentation

## 2017-08-01 DIAGNOSIS — J029 Acute pharyngitis, unspecified: Secondary | ICD-10-CM | POA: Diagnosis present

## 2017-08-01 HISTORY — DX: Ulcerative colitis, unspecified, without complications: K51.90

## 2017-08-01 LAB — RAPID STREP SCREEN (MED CTR MEBANE ONLY): Streptococcus, Group A Screen (Direct): NEGATIVE

## 2017-08-01 MED ORDER — DEXAMETHASONE SODIUM PHOSPHATE 10 MG/ML IJ SOLN
10.0000 mg | Freq: Once | INTRAMUSCULAR | Status: AC
  Administered 2017-08-01: 10 mg via INTRAMUSCULAR
  Filled 2017-08-01: qty 1

## 2017-08-01 MED ORDER — AMOXICILLIN-POT CLAVULANATE 875-125 MG PO TABS: 1.0000 | ORAL_TABLET | Freq: Two times a day (BID) | ORAL | 0 refills | Status: AC

## 2017-08-01 NOTE — ED Notes (Signed)
Alert, NAD, calm, interactive, resps e/u, speaking in clear complete sentences, no dyspnea noted, skin W&D, initial VSS, c/o sore throat, here Monday for the same, mentions night sweats, (denies: sob, fever, NV, dizziness or visual changes).

## 2017-08-01 NOTE — ED Triage Notes (Signed)
Pt seen here earlier in the week for sore throat and flu Sx. States everything has improved but she states her throat is getting worse. Strep test was neg

## 2017-08-01 NOTE — ED Provider Notes (Signed)
[facility] EMERGENCY DEPARTMENT Provider Note   CSN: [id number] Arrival date & time: 08/01/17  0014     History   Chief Complaint Chief Complaint  Patient presents with  . Sore Throat    HPI Amanda Dominguez is a 27 y.o. female.  HPI  27 year old female with recent upper respiratory symptoms who presents with persistent sore throat.  Patient reports she was seen and evaluated 1 week ago with upper respiratory symptoms including congestion, sore throat, fever, ear pain.  All of her symptoms have resolved set for persistent sore throat.  She states it feels like knives when she swallows.  She is able to swallow.  She denies any recent fevers.  She has not taken anything for her pain.  Reports that her strep screen was negative.  Chart reviewed.  Patient did have a negative strep screen 1 week ago.  Past Medical History:  Diagnosis Date  . Anxiety   . Colitis   . Ulcerative colitis [facility])     Patient Active Problem List   Diagnosis Date Noted  . SBO (small bowel obstruction) (HCC) 07/08/2015  . Hypokalemia 07/08/2015  . Ulcerative colitis (HCC) 07/08/2015    History reviewed. No pertinent surgical history.   OB History    Gravida  1   Para      Term      Preterm      AB      Living        SAB      TAB      Ectopic      Multiple      Live Births               Home Medications    Prior to Admission medications   Medication Sig Start Date End Date Taking? Authorizing Provider  amoxicillin-clavulanate (AUGMENTIN) 875-125 MG tablet Take 1 tablet by mouth every 12 (twelve) hours. 08/01/17   Drucella Karbowski, Mayer Masker, MD  cephALEXin (KEFLEX) 500 MG capsule Take 1 capsule (500 mg total) by mouth 2 (two) times daily. 06/02/16   Ward, Layla Maw, DO  ibuprofen (ADVIL,MOTRIN) 800 MG tablet Take 1 tablet (800 mg total) by mouth 3 (three) times daily. 07/26/17   Palumbo, April, MD  mesalamine (LIALDA) 1.2 g EC tablet Take 1.2 g by mouth 3 (three) times daily.      [provider]  ondansetron (ZOFRAN ODT) 8 MG disintegrating tablet  ODT q12 hours prn nausea 07/26/17   Palumbo, April, MD    Family History Family History  Problem Relation Age of Onset  . Heart attack Father     Social History Social History   Tobacco Use  . Smoking status: Current Every Day Smoker    Packs/day: 0.50    Types: Cigarettes  . Smokeless tobacco: Never Used  Substance Use Topics  . Alcohol use: No  . Drug use: No     Allergies   Patient has no known allergies.   Review of Systems Review of Systems  Constitutional: Negative for fever.  HENT: Positive for sore throat. Negative for congestion and trouble swallowing.   Respiratory: Negative for cough and shortness of breath.   Cardiovascular: Negative for chest pain.  Gastrointestinal: Negative for abdominal pain, nausea and vomiting.  Genitourinary: Negative for dysuria.  All other systems reviewed and are negative.    Physical Exam Updated Vital Signs BP 110/82 (BP Location: Right Arm)   Pulse 83   Temp 99.1 F (37.3 C) (Oral)  Resp 18   Ht  (1.549 m)   Wt 47.3 kg (104 lb 4.4 oz)   LMP 07/02/2017 (Approximate)   SpO2 100%   BMI 19.70 kg/m   Physical Exam  Constitutional: She is oriented to person, place, and time. She appears well-developed and well-nourished. No distress.  HENT:  Head: Normocephalic and atraumatic.  Right Ear: Tympanic membrane normal.  Left Ear: Tympanic membrane normal.  Patient with posterior oropharyngeal erythema, exudate noted on bilateral tonsils with mild swelling, uvula midline, palatal petechiae noted  Eyes: Pupils are equal, round, and reactive to light.  Neck: Normal range of motion. Neck supple.  Cardiovascular: Normal rate, regular rhythm and normal heart sounds.  Pulmonary/Chest: Effort normal. No respiratory distress. She has no wheezes.  Abdominal: Soft. There is no tenderness.  Lymphadenopathy:    She has no cervical adenopathy.    Neurological: She is alert and oriented to person, place, and time.  Skin: Skin is warm and dry.  Psychiatric: She has a normal mood and affect.  Nursing note and vitals reviewed.    ED Treatments / Results  Labs (all labs ordered are listed, but only abnormal results are displayed) Labs Reviewed  RAPID STREP SCREEN ([facility] ONLY)  CULTURE, GROUP A STREP [facility])    EKG None  Radiology No results found.  Procedures Procedures (including critical care time)  Medications Ordered in ED Medications  dexamethasone (DECADRON) injection 10 mg (10 mg Intramuscular Given 08/01/17 0324)     Initial Impression / Assessment and Plan / ED Course  I have reviewed the triage vital signs and the nursing notes.  Pertinent labs & imaging results that were available during my care of the patient were reviewed by me and considered in my medical decision making (see chart for details).     She presents with persistent sore throat after recent diagnosis of URI.  She is nontoxic-appearing on exam and vital signs are reassuring.  ABCs are intact.  She does have tonsillar exudate and erythema.  No asymmetric swelling to suggest peritonsillar abscess or deep space infection.  Likely tonsillitis.  Repeat strep screen is negative.  Will treat with Augmentin given ongoing and persistent symptoms.  After history, exam, and medical workup I feel the patient has been appropriately medically screened and is safe for discharge home. Pertinent diagnoses were discussed with the patient. Patient was given return precautions.  Final Clinical Impressions(s) / ED Diagnoses   Final diagnoses:  Tonsillitis    ED Discharge Orders        Ordered    amoxicillin-clavulanate (AUGMENTIN) 875-125 MG tablet  Every 12 hours     08/01/17 0345       Shon Baton, MD 08/01/17 2306

## 2017-08-03 LAB — CULTURE, GROUP A STREP (THRC)

## 2017-10-25 DIAGNOSIS — F17210 Nicotine dependence, cigarettes, uncomplicated: Secondary | ICD-10-CM | POA: Insufficient documentation

## 2017-10-25 DIAGNOSIS — R0789 Other chest pain: Secondary | ICD-10-CM | POA: Insufficient documentation

## 2017-10-25 DIAGNOSIS — R112 Nausea with vomiting, unspecified: Secondary | ICD-10-CM | POA: Diagnosis not present

## 2017-10-25 DIAGNOSIS — Z79899 Other long term (current) drug therapy: Secondary | ICD-10-CM | POA: Diagnosis not present

## 2017-10-25 DIAGNOSIS — R197 Diarrhea, unspecified: Secondary | ICD-10-CM | POA: Diagnosis not present

## 2017-10-25 DIAGNOSIS — Z3202 Encounter for pregnancy test, result negative: Secondary | ICD-10-CM | POA: Diagnosis not present

## 2017-10-26 ENCOUNTER — Encounter (HOSPITAL_BASED_OUTPATIENT_CLINIC_OR_DEPARTMENT_OTHER): Payer: Self-pay

## 2017-10-26 ENCOUNTER — Emergency Department (HOSPITAL_BASED_OUTPATIENT_CLINIC_OR_DEPARTMENT_OTHER)
Admission: EM | Admit: 2017-10-26 | Discharge: 2017-10-26 | Disposition: A | Discharge status: Home / Self Care | Payer: Medicaid Other | Attending: Emergency Medicine | Admitting: Emergency Medicine

## 2017-10-26 ENCOUNTER — Other Ambulatory Visit: Payer: Self-pay

## 2017-10-26 ENCOUNTER — Emergency Department (HOSPITAL_BASED_OUTPATIENT_CLINIC_OR_DEPARTMENT_OTHER): Payer: Medicaid Other

## 2017-10-26 DIAGNOSIS — R0789 Other chest pain: Secondary | ICD-10-CM

## 2017-10-26 DIAGNOSIS — R197 Diarrhea, unspecified: Secondary | ICD-10-CM

## 2017-10-26 DIAGNOSIS — R1110 Vomiting, unspecified: Secondary | ICD-10-CM

## 2017-10-26 LAB — URINALYSIS, MICROSCOPIC (REFLEX)

## 2017-10-26 LAB — CBC WITH DIFFERENTIAL/PLATELET
BASOS ABS: 0 10*3/uL (ref 0.0–0.1)
Basophils Relative: 0 %
Eosinophils Absolute: 0.1 10*3/uL (ref 0.0–0.7)
Eosinophils Relative: 1 %
HEMATOCRIT: 35.4 % — AB (ref 36.0–46.0)
Hemoglobin: 12.1 g/dL (ref 12.0–15.0)
LYMPHS PCT: 12 %
Lymphs Abs: 1.7 10*3/uL (ref 0.7–4.0)
MCH: 28.1 pg (ref 26.0–34.0)
MCHC: 34.2 g/dL (ref 30.0–36.0)
MCV: 82.3 fL (ref 78.0–100.0)
Monocytes Absolute: 1 10*3/uL (ref 0.1–1.0)
Monocytes Relative: 7 %
Neutro Abs: 12 10*3/uL — ABNORMAL HIGH (ref 1.7–7.7)
Neutrophils Relative %: 80 %
Platelets: 285 10*3/uL (ref 150–400)
RBC: 4.3 MIL/uL (ref 3.87–5.11)
RDW: 14.8 % (ref 11.5–15.5)
WBC: 14.8 10*3/uL — AB (ref 4.0–10.5)

## 2017-10-26 LAB — URINALYSIS, ROUTINE W REFLEX MICROSCOPIC
GLUCOSE, UA: NEGATIVE mg/dL
Hgb urine dipstick: NEGATIVE
Ketones, ur: 40 mg/dL — AB
LEUKOCYTES UA: NEGATIVE
Nitrite: NEGATIVE
PH: 6 (ref 5.0–8.0)
Protein, ur: >300 mg/dL — AB
Specific Gravity, Urine: 1.025 (ref 1.005–1.030)

## 2017-10-26 LAB — COMPREHENSIVE METABOLIC PANEL
ALBUMIN: 3.5 g/dL (ref 3.5–5.0)
ALT: 9 U/L (ref 0–44)
AST: 16 U/L (ref 15–41)
Alkaline Phosphatase: 67 U/L (ref 38–126)
Anion gap: 15 (ref 5–15)
BILIRUBIN TOTAL: 0.7 mg/dL (ref 0.3–1.2)
BUN: 8 mg/dL (ref 6–20)
CO2: 22 mmol/L (ref 22–32)
Calcium: 8.8 mg/dL — ABNORMAL LOW (ref 8.9–10.3)
Chloride: 97 mmol/L — ABNORMAL LOW (ref 98–111)
Creatinine, Ser: 0.5 mg/dL (ref 0.44–1.00)
GFR calc Af Amer: >60 mL/min (ref >60)
GFR calc non Af Amer: >60 mL/min (ref >60)
GLUCOSE: 102 mg/dL — AB (ref 70–99)
POTASSIUM: 3.1 mmol/L — AB (ref 3.5–5.1)
Sodium: 134 mmol/L — ABNORMAL LOW (ref 135–145)
TOTAL PROTEIN: 7.8 g/dL (ref 6.5–8.1)

## 2017-10-26 LAB — I-STAT CG4 LACTIC ACID, ED: Lactic Acid, Venous: 0.79 mmol/L (ref 0.5–1.9)

## 2017-10-26 LAB — PREGNANCY, URINE: Preg Test, Ur: NEGATIVE

## 2017-10-26 MED ORDER — ONDANSETRON HCL 4 MG/2ML IJ SOLN
4.0000 mg | Freq: Once | INTRAMUSCULAR | Status: AC
  Administered 2017-10-26: 4 mg via INTRAVENOUS
  Filled 2017-10-26: qty 2

## 2017-10-26 MED ORDER — SODIUM CHLORIDE 0.9 % IV BOLUS
1000.0000 mL | Freq: Once | INTRAVENOUS | Status: AC
  Administered 2017-10-26: 1000 mL via INTRAVENOUS

## 2017-10-26 MED ORDER — ONDANSETRON 4 MG PO TBDP
4.0000 mg | ORAL_TABLET | Freq: Three times a day (TID) | ORAL | 0 refills | Status: AC | PRN
Start: 2017-10-26 — End: ?

## 2017-10-26 NOTE — Discharge Instructions (Addendum)
You were seen today for nausea, vomiting, diarrhea, atypical chest pain.  Your symptoms are likely related to a viral illness.  You have no evidence of pneumonia and your lab work-up is largely reassuring.  Make sure to stay hydrated.

## 2017-10-26 NOTE — ED Notes (Signed)
Pt was given a Sprite for PO challenge

## 2017-10-26 NOTE — ED Provider Notes (Signed)
[facility] EMERGENCY DEPARTMENT Provider Note   CSN: [id number] Arrival date & time: 10/25/17  2353     History   Chief Complaint Chief Complaint  Patient presents with  . Emesis    HPI Amanda Dominguez is a 27 y.o. female.  HPI  This is a 27 year old female with history of ulcerative colitis who presents with nausea, vomiting, diarrhea, sore throat, chest pain.  Patient reports onset of symptoms last Wednesday.  Symptoms started with nonbilious, nonbloody emesis and nonbloody diarrhea.  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  She states over the last several days she is developed sore throat tonight developed some burning chest pain mostly of the left chest.  It is nonradiating.  It is worse with vomiting.  She has not taken anything for her symptoms.  She reports chills and fevers at home to 102 during this time.  Past Medical History:  Diagnosis Date  . Anxiety   . Colitis   . Ulcerative colitis [facility])     Patient Active Problem List   Diagnosis Date Noted  . SBO (small bowel obstruction) (HCC) 07/08/2015  . Hypokalemia 07/08/2015  . Ulcerative colitis (HCC) 07/08/2015    History reviewed. No pertinent surgical history.   OB History    Gravida  1   Para      Term      Preterm      AB      Living        SAB      TAB      Ectopic      Multiple      Live Births               Home Medications    Prior to Admission medications   Medication Sig Start Date End Date Taking? Authorizing Provider  amoxicillin-clavulanate (AUGMENTIN) 875-125 MG tablet Take 1 tablet by mouth every 12 (twelve) hours. 08/01/17   Horton, Mayer Masker, MD  cephALEXin (KEFLEX) 500 MG capsule Take 1 capsule (500 mg total) by mouth 2 (two) times daily. 06/02/16   Ward, Layla Maw, DO  ibuprofen (ADVIL,MOTRIN) 800 MG tablet Take 1 tablet (800 mg total) by mouth 3 (three) times daily. 07/26/17   Palumbo, April, MD  mesalamine (LIALDA) 1.2  g EC tablet Take 1.2 g by mouth 3 (three) times daily.     [provider]  ondansetron (ZOFRAN ODT) 4 MG disintegrating tablet Take 1 tablet (4 mg total) by mouth every 8 (eight) hours as needed for nausea or vomiting. 10/26/17   Horton, Mayer Masker, MD  ondansetron (ZOFRAN ODT) 8 MG disintegrating tablet 8mg  ODT q12 hours prn nausea 07/26/17   Palumbo, April, MD    Family History Family History  Problem Relation Age of Onset  . Heart attack Father     Social History Social History   Tobacco Use  . Smoking status: Current Every Day Smoker    Packs/day: 0.50    Types: Cigarettes  . Smokeless tobacco: Never Used  Substance Use Topics  . Alcohol use: No  . Drug use: No     Allergies   Patient has no known allergies.   Review of Systems Review of Systems  Constitutional: Positive for chills and fever.  HENT: Positive for sore throat.   Respiratory: Negative for cough and shortness of breath.   Cardiovascular: Positive for chest pain. Negative for leg swelling.  Gastrointestinal: Positive for diarrhea, nausea and vomiting.  Negative for abdominal pain.  Genitourinary: Negative for dysuria.  All other systems reviewed and are negative.    Physical Exam Updated Vital Signs BP 109/66   Pulse 94   Temp 100.1 F (37.8 C) (Oral)   Resp 11   Ht 5\' 2"  (1.575 m)   Wt 49.9 kg (110 lb)   LMP 10/14/2017 (Exact Date)   SpO2 98%   BMI 20.12 kg/m   Physical Exam  Constitutional: She is oriented to person, place, and time. She appears well-developed and well-nourished.  HENT:  Head: Normocephalic and atraumatic.  Mouth/Throat: Oropharynx is clear and moist. No oropharyngeal exudate.  mucous membranes dry Oropharynx slightly erythematous  Eyes: Pupils are equal, round, and reactive to light.  Neck: Neck supple.  Cardiovascular: Normal rate, regular rhythm and normal heart sounds.  Pulmonary/Chest: Effort normal. No respiratory distress. She has no wheezes. She  exhibits tenderness.  Abdominal: Soft. Bowel sounds are normal. There is no tenderness. There is no rebound and no guarding.  Neurological: She is alert and oriented to person, place, and time.  Skin: Skin is warm and dry.  Psychiatric: She has a normal mood and affect.  Nursing note and vitals reviewed.    ED Treatments / Results  Labs (all labs ordered are listed, but only abnormal results are displayed) Labs Reviewed  COMPREHENSIVE METABOLIC PANEL - Abnormal; Notable for the following components:      Result Value   Sodium 134 (*)    Potassium 3.1 (*)    Chloride 97 (*)    Glucose, Bld 102 (*)    Calcium 8.8 (*)    All other components within normal limits  CBC WITH DIFFERENTIAL/PLATELET - Abnormal; Notable for the following components:   WBC 14.8 (*)    HCT 35.4 (*)    Neutro Abs 12.0 (*)    All other components within normal limits  URINALYSIS, ROUTINE W REFLEX MICROSCOPIC - Abnormal; Notable for the following components:   APPearance CLOUDY (*)    Bilirubin Urine MODERATE (*)    Ketones, ur 40 (*)    Protein, ur >300 (*)    All other components within normal limits  URINALYSIS, MICROSCOPIC (REFLEX) - Abnormal; Notable for the following components:   Bacteria, UA FEW (*)    All other components within normal limits  PREGNANCY, URINE  I-STAT CG4 LACTIC ACID, ED  I-STAT CG4 LACTIC ACID, ED    EKG EKG Interpretation  Date/Time:  Wednesday October 26 2017 00:17:18 EDT Ventricular Rate:  108 PR Interval:  132 QRS Duration: 84 QT Interval:  340 QTC Calculation: 455 R Axis:   70 Text Interpretation:  Sinus tachycardia Right atrial enlargement Nonspecific ST abnormality Abnormal ECG Confirmed by Ross MarcusHorton, Courtney ([id number]) on 10/26/2017 1:18:36 AM   Radiology Dg Chest 2 View  Result Date: 10/26/2017 CLINICAL DATA:  Fever, chest pain, shortness of breath EXAM: CHEST - 2 VIEW COMPARISON:  04/06/2015 FINDINGS: Lungs are clear.  No pleural effusion or pneumothorax. The heart  is normal in size. Visualized osseous structures are within normal limits. IMPRESSION: Normal chest radiographs. Electronically Signed   By: Charline BillsSriyesh  Krishnan M.D.   On: 10/26/2017 00:53    Procedures Procedures (including critical care time)  Medications Ordered in ED Medications  sodium chloride 0.9 % bolus 1,000 mL (0 mLs Intravenous Stopped 10/26/17 0217)  ondansetron (ZOFRAN) injection 4 mg (4 mg Intravenous Given 10/26/17 0116)     Initial Impression / Assessment and Plan / ED Course  I have reviewed  the triage vital signs and the nursing notes.  Pertinent labs & imaging results that were available during my care of the patient were reviewed by me and considered in my medical decision making (see chart for details).     Patient presents with nausea, vomiting, sore throat, chest pain.  She is overall nontoxic-appearing on exam.  She was febrile and mildly tachycardic.  Suspect viral etiology.  Doubt strep given overall symptoms.  Chest pain is reproducible.  EKG is reassuring and chest x-ray shows no evidence of pneumonia.  Patient was given fluids and nausea medication.  Lab work-up is largely reassuring.  She is remained clinically stable.  Suspect viral etiology.  Recommend supportive measures at home.  After history, exam, and medical workup I feel the patient has been appropriately medically screened and is safe for discharge home. Pertinent diagnoses were discussed with the patient. Patient was given return precautions.   Final Clinical Impressions(s) / ED Diagnoses   Final diagnoses:  Vomiting and diarrhea  Atypical chest pain    ED Discharge Orders        Ordered    ondansetron (ZOFRAN ODT) 4 MG disintegrating tablet  Every 8 hours PRN     10/26/17 0228       Shon Baton, MD 10/26/17 [phone]

## 2017-10-26 NOTE — ED Triage Notes (Addendum)
Pt c/o N/V x1 week. Today pt c/o sore throat and "chest feels like it is caving in." Pt also endorses fever with TMax 102.4

## 2017-10-26 NOTE — ED Notes (Signed)
Pt understood dc material. NAD noted. Script given at dc  

## 2017-10-30 ENCOUNTER — Other Ambulatory Visit: Payer: Self-pay

## 2017-10-30 ENCOUNTER — Emergency Department (HOSPITAL_BASED_OUTPATIENT_CLINIC_OR_DEPARTMENT_OTHER)
Admission: EM | Admit: 2017-10-30 | Discharge: 2017-10-30 | Disposition: A | Discharge status: Home / Self Care | Payer: Medicaid Other | Attending: Emergency Medicine | Admitting: Emergency Medicine

## 2017-10-30 ENCOUNTER — Encounter (HOSPITAL_BASED_OUTPATIENT_CLINIC_OR_DEPARTMENT_OTHER): Payer: Self-pay

## 2017-10-30 DIAGNOSIS — R112 Nausea with vomiting, unspecified: Secondary | ICD-10-CM | POA: Diagnosis not present

## 2017-10-30 DIAGNOSIS — Z5321 Procedure and treatment not carried out due to patient leaving prior to being seen by health care provider: Secondary | ICD-10-CM | POA: Diagnosis not present

## 2017-10-30 DIAGNOSIS — R197 Diarrhea, unspecified: Secondary | ICD-10-CM | POA: Diagnosis not present

## 2017-10-30 DIAGNOSIS — R109 Unspecified abdominal pain: Secondary | ICD-10-CM | POA: Insufficient documentation

## 2017-10-30 LAB — GROUP A STREP BY PCR: Group A Strep by PCR: NOT DETECTED

## 2017-10-30 NOTE — ED Triage Notes (Signed)
Pt c/o N/V/D x 2 weeks. Seen recently for similar symptoms. Pt reports increased weakness and fatigued. No intake.

## 2017-10-30 NOTE — ED Notes (Signed)
Pt called to update vitals x 3 no answer. Lobby checked.

## 2017-10-31 NOTE — ED Notes (Signed)
Follow up call made  No answer  10/31/17//1033  s Mohab Ashby rn

## 2020-03-04 IMAGING — DX DG CHEST 2V
2 series · 2 of 2 positions shown · non-contrast
Comparison: 04/06/2015

CLINICAL DATA: Fever, chest pain, shortness of breath

EXAM:
CHEST - 2 VIEW

[chest pa]
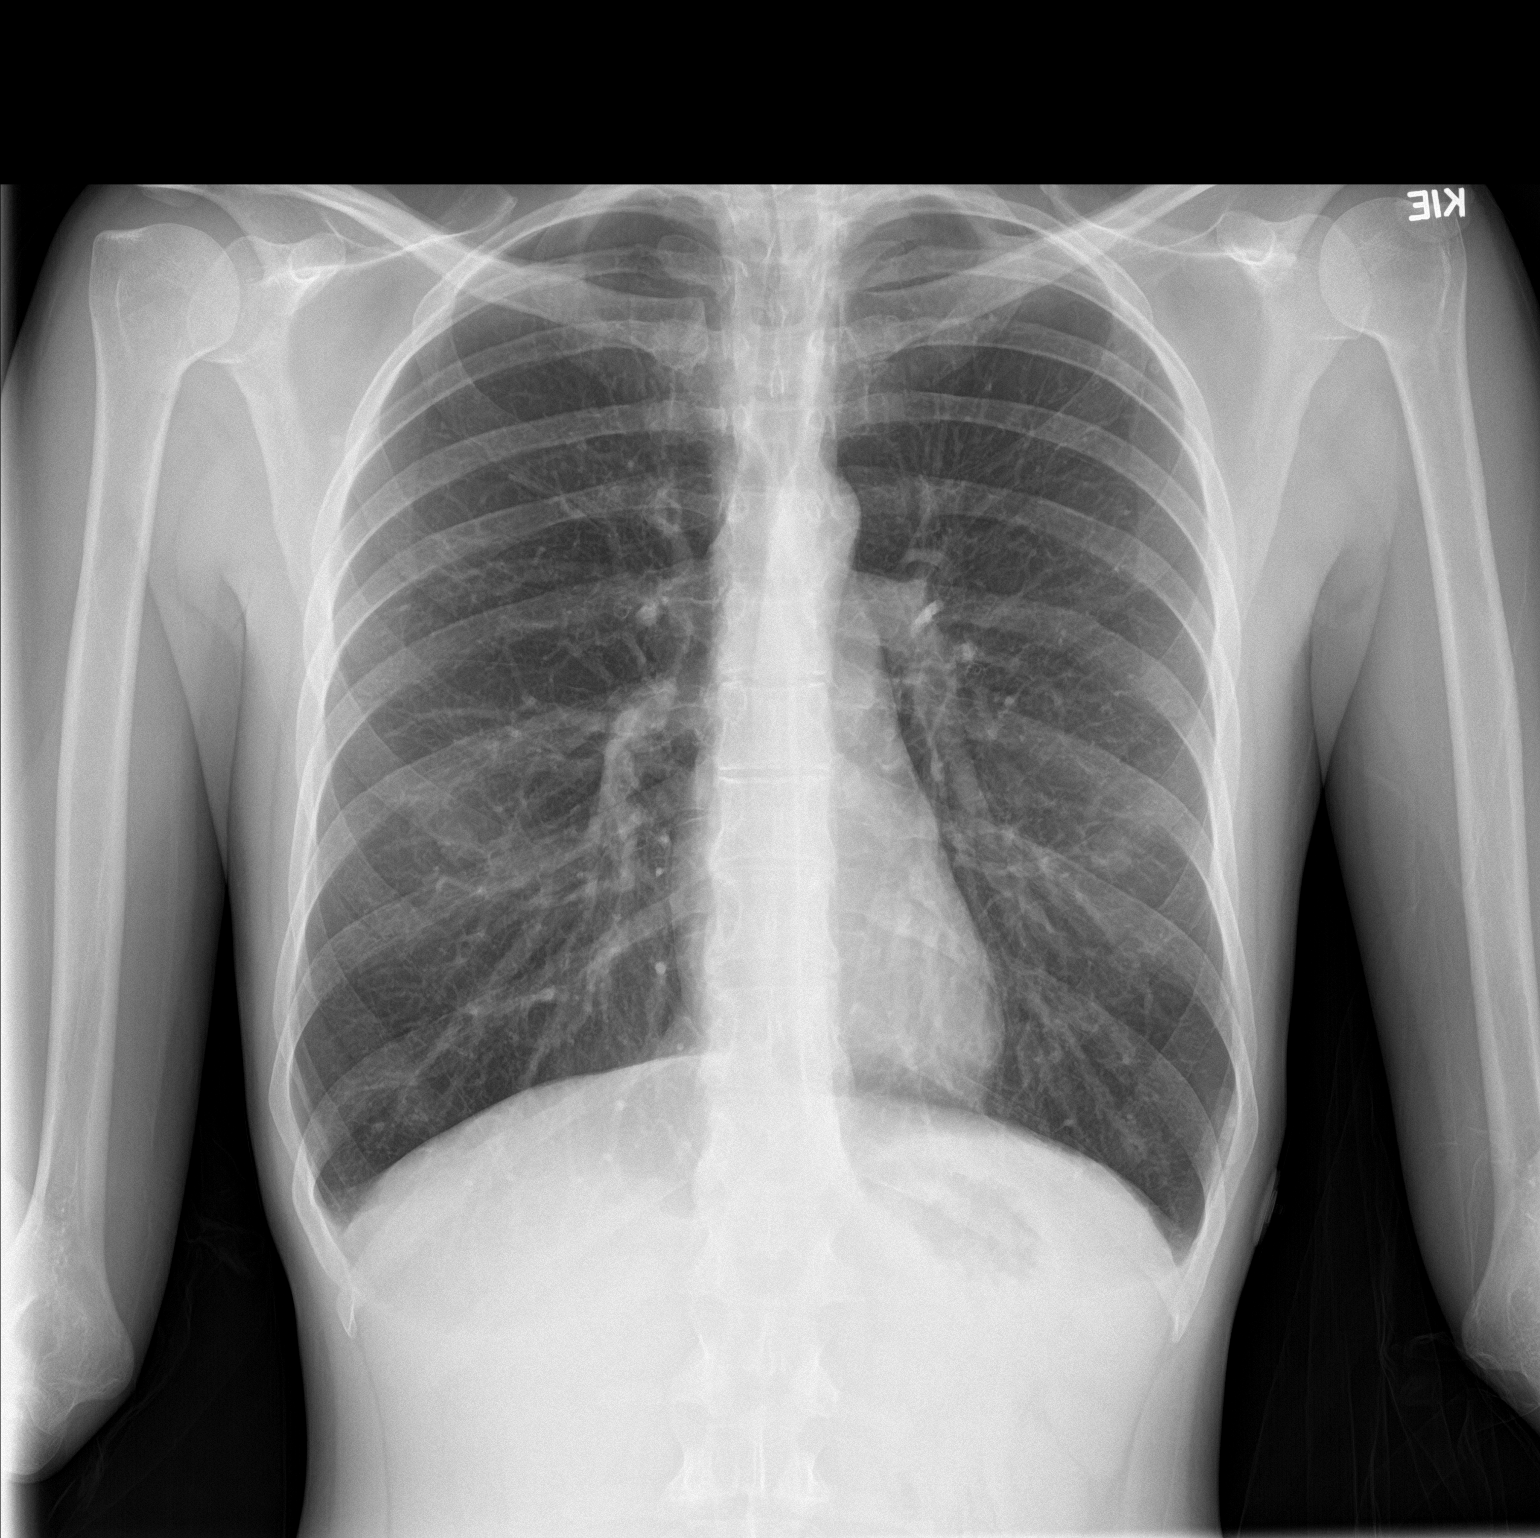

[chest lat]
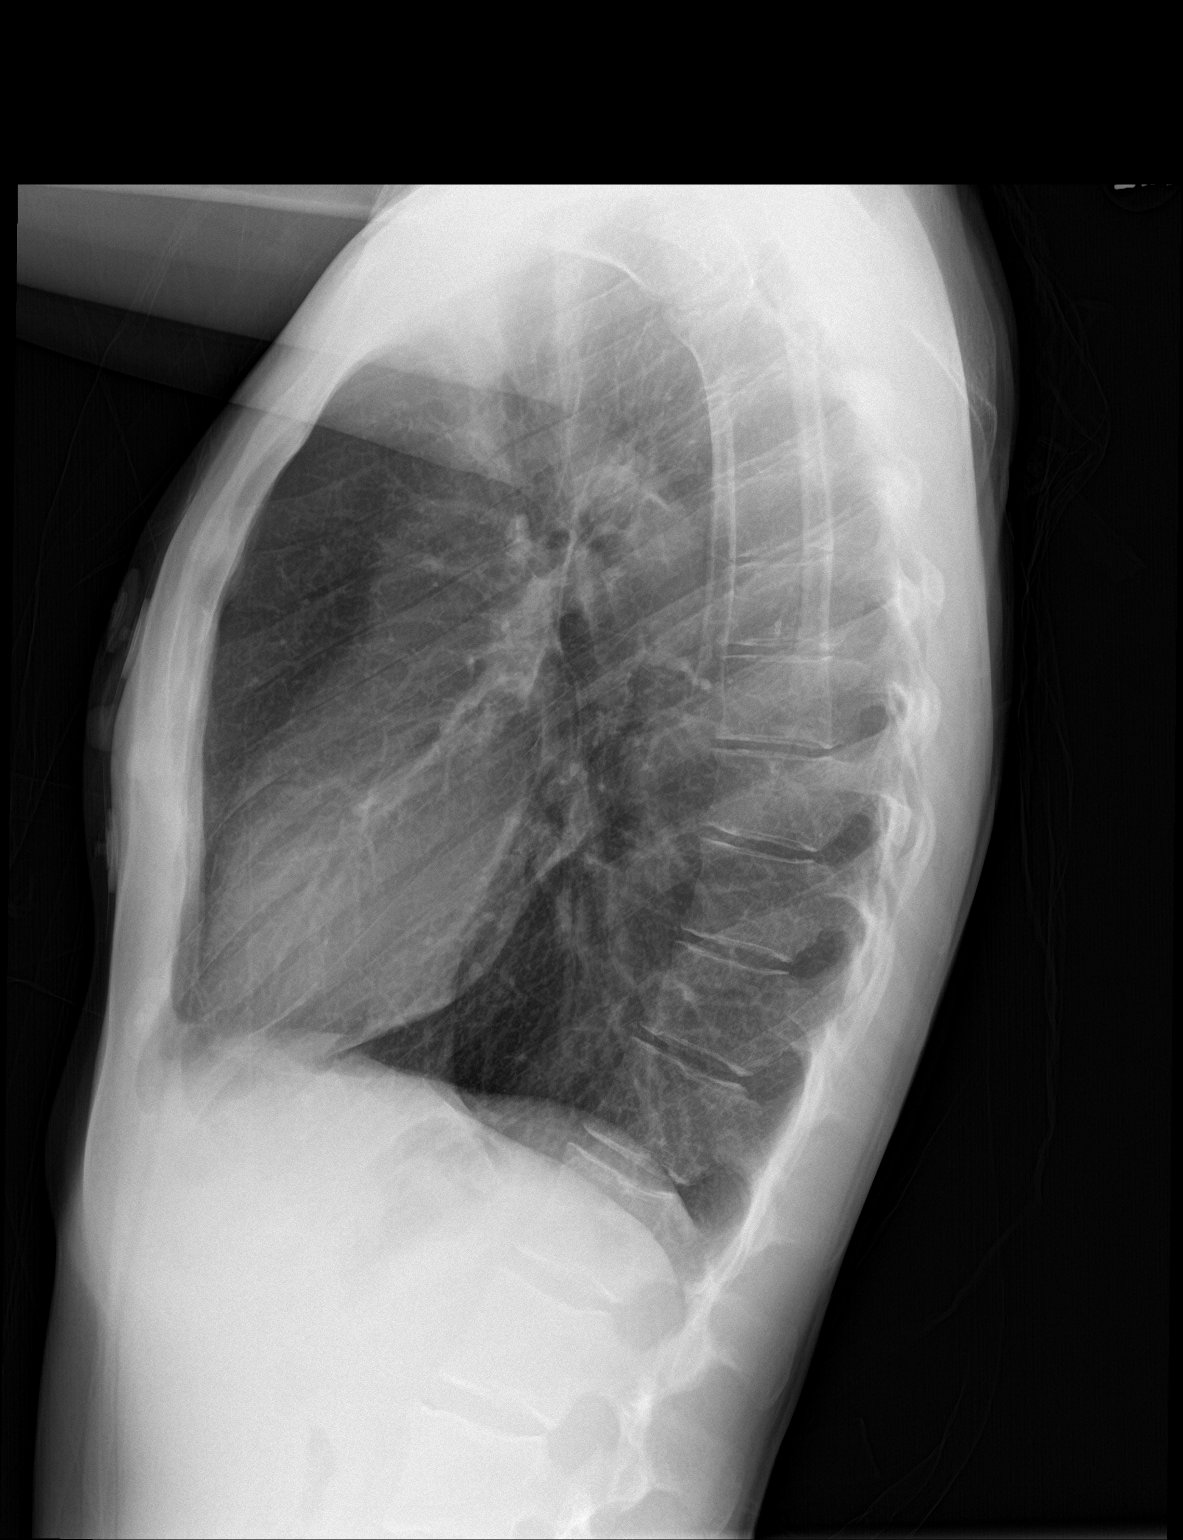

[2 of 2 positions shown; findings below may reference images not displayed]

FINDINGS: Lungs are clear.  No pleural effusion or pneumothorax.

The heart is normal in size.

Visualized osseous structures are within normal limits.
IMPRESSION: Normal chest radiographs.
